# Patient Record
Sex: Male | Born: 1975 | Race: White | Hispanic: No | Marital: Married | Smoking: Never smoker
Health system: Southern US, Community
[De-identification: ages and names within clinical notes are randomized; demographics above are authoritative.]

## PROBLEM LIST (undated history)

## (undated) DIAGNOSIS — Z87898 Personal history of other specified conditions: Secondary | ICD-10-CM

## (undated) DIAGNOSIS — T7840XA Allergy, unspecified, initial encounter: Secondary | ICD-10-CM

## (undated) DIAGNOSIS — B029 Zoster without complications: Secondary | ICD-10-CM

## (undated) DIAGNOSIS — K2980 Duodenitis without bleeding: Secondary | ICD-10-CM

## (undated) DIAGNOSIS — F410 Panic disorder [episodic paroxysmal anxiety] without agoraphobia: Secondary | ICD-10-CM

## (undated) DIAGNOSIS — R61 Generalized hyperhidrosis: Secondary | ICD-10-CM

## (undated) DIAGNOSIS — K449 Diaphragmatic hernia without obstruction or gangrene: Secondary | ICD-10-CM

## (undated) DIAGNOSIS — K649 Unspecified hemorrhoids: Secondary | ICD-10-CM

## (undated) DIAGNOSIS — R011 Cardiac murmur, unspecified: Secondary | ICD-10-CM

## (undated) DIAGNOSIS — E785 Hyperlipidemia, unspecified: Secondary | ICD-10-CM

## (undated) DIAGNOSIS — F419 Anxiety disorder, unspecified: Secondary | ICD-10-CM

## (undated) DIAGNOSIS — K219 Gastro-esophageal reflux disease without esophagitis: Secondary | ICD-10-CM

## (undated) DIAGNOSIS — R9431 Abnormal electrocardiogram [ECG] [EKG]: Secondary | ICD-10-CM

## (undated) DIAGNOSIS — A048 Other specified bacterial intestinal infections: Secondary | ICD-10-CM

## (undated) HISTORY — DX: Panic disorder (episodic paroxysmal anxiety): F41.0

## (undated) HISTORY — DX: Zoster without complications: B02.9

## (undated) HISTORY — DX: Unspecified hemorrhoids: K64.9

## (undated) HISTORY — DX: Abnormal electrocardiogram (ECG) (EKG): R94.31

## (undated) HISTORY — DX: Generalized hyperhidrosis: R61

## (undated) HISTORY — DX: Personal history of other specified conditions: Z87.898

## (undated) HISTORY — PX: TONSILLECTOMY: SUR1361

## (undated) HISTORY — PX: VASECTOMY: SHX75

## (undated) HISTORY — DX: Duodenitis without bleeding: K29.80

## (undated) HISTORY — DX: Diaphragmatic hernia without obstruction or gangrene: K44.9

## (undated) HISTORY — DX: Cardiac murmur, unspecified: R01.1

## (undated) HISTORY — PX: MOLE REMOVAL: SHX2046

## (undated) HISTORY — DX: Other specified bacterial intestinal infections: A04.8

## (undated) HISTORY — PX: COLONOSCOPY: SHX174

## (undated) HISTORY — DX: Hyperlipidemia, unspecified: E78.5

## (undated) HISTORY — DX: Anxiety disorder, unspecified: F41.9

## (undated) HISTORY — DX: Allergy, unspecified, initial encounter: T78.40XA

## (undated) HISTORY — DX: Gastro-esophageal reflux disease without esophagitis: K21.9

## (undated) HISTORY — PX: POLYPECTOMY: SHX149

---

## 2005-01-31 ENCOUNTER — Ambulatory Visit: Payer: Self-pay | Admitting: Internal Medicine

## 2006-05-27 ENCOUNTER — Encounter: Payer: Self-pay | Admitting: Gastroenterology

## 2006-06-28 ENCOUNTER — Ambulatory Visit: Payer: Self-pay | Admitting: Gastroenterology

## 2006-07-19 ENCOUNTER — Ambulatory Visit: Payer: Self-pay | Admitting: Gastroenterology

## 2006-07-19 LAB — CONVERTED CEMR LAB
Fecal Occult Blood: NEGATIVE
OCCULT 4: NEGATIVE

## 2006-08-09 ENCOUNTER — Ambulatory Visit: Payer: Self-pay | Admitting: Gastroenterology

## 2006-08-09 LAB — CONVERTED CEMR LAB: Sed Rate: 7 mm/hr (ref 0–20)

## 2006-08-27 ENCOUNTER — Ambulatory Visit: Payer: Self-pay | Admitting: Gastroenterology

## 2006-08-27 ENCOUNTER — Encounter: Payer: Self-pay | Admitting: Gastroenterology

## 2006-08-27 DIAGNOSIS — K2970 Gastritis, unspecified, without bleeding: Secondary | ICD-10-CM | POA: Insufficient documentation

## 2006-08-27 DIAGNOSIS — K2990 Gastroduodenitis, unspecified, without bleeding: Secondary | ICD-10-CM

## 2006-08-27 DIAGNOSIS — K2980 Duodenitis without bleeding: Secondary | ICD-10-CM | POA: Insufficient documentation

## 2006-08-27 DIAGNOSIS — K449 Diaphragmatic hernia without obstruction or gangrene: Secondary | ICD-10-CM | POA: Insufficient documentation

## 2007-07-19 DIAGNOSIS — K625 Hemorrhage of anus and rectum: Secondary | ICD-10-CM | POA: Insufficient documentation

## 2007-07-19 DIAGNOSIS — R51 Headache: Secondary | ICD-10-CM | POA: Insufficient documentation

## 2007-07-19 DIAGNOSIS — R0789 Other chest pain: Secondary | ICD-10-CM | POA: Insufficient documentation

## 2007-07-19 DIAGNOSIS — R519 Headache, unspecified: Secondary | ICD-10-CM | POA: Insufficient documentation

## 2009-07-08 DIAGNOSIS — Z8042 Family history of malignant neoplasm of prostate: Secondary | ICD-10-CM | POA: Insufficient documentation

## 2009-07-08 DIAGNOSIS — R011 Cardiac murmur, unspecified: Secondary | ICD-10-CM | POA: Insufficient documentation

## 2009-07-08 DIAGNOSIS — E785 Hyperlipidemia, unspecified: Secondary | ICD-10-CM | POA: Insufficient documentation

## 2009-07-08 DIAGNOSIS — R7301 Impaired fasting glucose: Secondary | ICD-10-CM | POA: Insufficient documentation

## 2009-07-08 DIAGNOSIS — F419 Anxiety disorder, unspecified: Secondary | ICD-10-CM | POA: Insufficient documentation

## 2009-07-30 DIAGNOSIS — R61 Generalized hyperhidrosis: Secondary | ICD-10-CM | POA: Insufficient documentation

## 2009-08-10 ENCOUNTER — Encounter: Payer: Self-pay | Admitting: Cardiology

## 2009-09-29 ENCOUNTER — Ambulatory Visit: Payer: Self-pay | Admitting: Cardiology

## 2009-09-29 DIAGNOSIS — R9431 Abnormal electrocardiogram [ECG] [EKG]: Secondary | ICD-10-CM | POA: Insufficient documentation

## 2009-10-19 ENCOUNTER — Ambulatory Visit: Payer: Self-pay | Admitting: Internal Medicine

## 2009-10-19 ENCOUNTER — Ambulatory Visit (HOSPITAL_COMMUNITY): Admission: RE | Admit: 2009-10-19 | Discharge: 2009-10-19 | Payer: Self-pay | Admitting: Cardiology

## 2009-10-19 ENCOUNTER — Encounter: Payer: Self-pay | Admitting: Cardiology

## 2009-10-19 ENCOUNTER — Ambulatory Visit: Payer: Self-pay

## 2009-10-21 ENCOUNTER — Telehealth (INDEPENDENT_AMBULATORY_CARE_PROVIDER_SITE_OTHER): Payer: Self-pay | Admitting: *Deleted

## 2009-10-22 ENCOUNTER — Telehealth (INDEPENDENT_AMBULATORY_CARE_PROVIDER_SITE_OTHER): Payer: Self-pay | Admitting: *Deleted

## 2009-10-22 ENCOUNTER — Telehealth: Payer: Self-pay | Admitting: Cardiology

## 2010-02-24 DIAGNOSIS — R059 Cough, unspecified: Secondary | ICD-10-CM | POA: Insufficient documentation

## 2010-04-05 NOTE — Letter (Signed)
Summary: [facility]: Referral Form  [facility]: Referral Form   Imported By: Earl Many 10/01/2009 11:43:50  _____________________________________________________________________  External Attachment:    Type:   Image     Comment:   External Document

## 2010-04-05 NOTE — Assessment & Plan Note (Signed)
Summary: NP6/ CARDIAC MURMUR/CHEST PAINS-MB   Visit Type:  new pt visit Referring Provider:  Dr. Waynard Mcconnell Primary Provider:  Dr. Waynard Mcconnell  CC:  pt referred by Tyler. Waynard Mcconnell for chest discomfort pt states though that Tyler. Waynard Mcconnell thinks this may be related to when pt had shingles back in 04..pt states he has a family h/o Hypocardiomyopathy...no cardiac complaints today..Tyler. Also sees pt's father and brother.  History of Present Illness: Tyler Mcconnell it is a delightful 35 year old married white male who comes today for evaluation of chest discomfort and a family history of hypertrophic obstructive cardiomyopathy.  His brother this patient on an as very mild nonobstructive hypertrophic cardiopathy. His brother's daughter has just been diagnosed as well.  Mr. Tyler Mcconnell describes his discomfort as being across his left chest radiating into the back. He is really in the area over his breast and below. It is described as a dull ache and it comes and goes spontaneously. It is not made worse by exercise. He is an avid runner and has no limitations including shortness of breath, presyncope, palpitations or chest pain.  He has a history of shingles affecting that area of his chest in the past.  There is no family history of sudden cardiac death.  His risk factors are minimal for any coronary disease.  He is been told he has a heart murmur and an abnormal EKG. In fact, he carries a copy of his EKG and his wallet. EKG shows very narrow non-diagnostic deep Q waves inferiorly in 23 and aVF with increased R wave in V1.  Apparently he had a workup in [place] but doesn't remember being told that he had any previous injury to his heart or other problems. Details unknown.  Current Medications (verified): 1)  Lovaza 1 Gm Caps (Omega-3-Acid Ethyl Esters) .Marland Kitchen.. 1 Cap At Bedtime 2)  Nexium 40 Mg Cpdr (Esomeprazole Magnesium) .Marland Kitchen.. 1 Cap As Needed 3)  Multivitamins   Tabs (Multiple Vitamin) .... As Needed  Allergies  (verified): 1)  ! Pcn  Past History:  Past Medical History: Last updated: 07/19/2007 Current Problems:  HEADACHE, CHRONIC (ICD-784.0) CHEST PAIN, ATYPICAL (ICD-786.59) RECTAL BLEEDING (ICD-569.3) GASTRITIS (ICD-535.50) DUODENITIS, WITHOUT HEMORRHAGE (ICD-535.60) HIATAL HERNIA (ICD-553.3)  Past Surgical History: Last updated: 07/19/2007 Tonsillectomy 1997  Review of Systems       negative other than history of present illness  Vital Signs:  Patient profile:   35 year old male Height:      70 inches Weight:      176.8 pounds BMI:     25.46 Pulse rate:   61 / minute Pulse rhythm:   irregular BP sitting:   118 / 60  (left arm) Cuff size:   large  Vitals Entered By: Tyler Mcconnell, CMA (September 29, 2009 3:03 PM)  Physical Exam  General:  Well developed, well nourished, in no acute distress. Head:  normocephalic and atraumatic Eyes:  PERRLA/EOM intact; conjunctiva and lids normal. Neck:  Neck supple, no JVD. No masses, thyromegaly or abnormal cervical nodes. Chest Tyler Mcconnell:  no deformities or breast masses noted Lungs:  Clear bilaterally to auscultation and percussion. Heart:  PMI nondisplaced, regular rate and rhythm, normal S1 and S2 splits. Systolic murmur grade 2/6 at the apex and left lower sternal border. It does not radiate to the carotids. It does not intensify from lying to standing. His carotid upstrokes are not bisferien. Abdomen:  Bowel sounds positive; abdomen soft and non-tender without masses, organomegaly, or hernias noted. No hepatosplenomegaly. Msk:  Back  normal, normal gait. Muscle strength and tone normal. Pulses:  pulses normal in all 4 extremities Extremities:  No clubbing or cyanosis. Neurologic:  Alert and oriented x 3. Skin:  Intact without lesions or rashes. Psych:  Normal affect.   Problems:  Medical Problems Added: 1)  Dx of Abnormal Ekg  (ICD-794.31)  EKG  Procedure date:  09/29/2009  Findings:      normal sinus rhythm, normal intervals,  narrow Q.'s in 23 and aVF, increased R wave in V1. No significant indication of LVH  Impression & Recommendations:  Problem # 1:  CHEST PAIN, ATYPICAL (ICD-786.59) Assessment New This Is not cardiac discomfort. I agree with Tyler.Perini that this is most likely neuralgic in nature from previous shingles. This discomfort would not in the something I would associate with hypertrophic cardiomyopathy. We will obtain a 2-D echocardiogram to evaluate for asymmetric septal hypertrophy or other types of hypertrophy. His systolic murmur need to be assessed as well, specifically to differentiate a left ventricular outflow tract murmur from being benign or related to asymmetric septal hypertrophy. Orders: Echocardiogram (Echo)  Problem # 2:  ABNORMAL EKG (ICD-794.31) This is unchanged from Tyler.Perini's office. It is not typical of hypertrophic cardiomyopathy but this clearly needs to be ruled out. He is carrying a copy of this in his wallet which I reinforced is extremely important to have on hand with future medical evaluations. Echocardiogram will be obtained as mentioned above. I doubt very seriously his head any injury to his inferior Tyler Mcconnell, in fact the Q waves are not pathologic.  Patient Instructions: 1)  Your physician recommends that you schedule a follow-up appointment as needed with Tyler Mcconnell 2)  Your physician has requested that you have an echocardiogram.  Echocardiography is a painless test that uses sound waves to create images of your heart. It provides your doctor with information about the size and shape of your heart and how well your heart's chambers and valves are working.  This procedure takes approximately one hour. There are no restrictions for this procedure.

## 2010-04-05 NOTE — Progress Notes (Signed)
  Request recieved from [facility] sent to [facility]. Cala Bradford Mesiemore  October 21, 2009 10:50 AM

## 2010-04-05 NOTE — Progress Notes (Signed)
Summary: [facility]: Office Visit  [facility]: Office Visit   Imported By: Earl Many 10/01/2009 11:48:50  _____________________________________________________________________  External Attachment:    Type:   Image     Comment:   External Document

## 2010-04-05 NOTE — Progress Notes (Signed)
Summary: test results-call after 1pm  Phone Note Call from Patient Call back at Home Phone [phone] Call back at Work Phone [phone] Call back at pls call after 1 p.m.    Caller: Patient Reason for Call: Talk to Nurse, Lab or Test Results Initial call taken by: Lorne Skeens,  October 22, 2009 9:54 AM  Follow-up for Phone Call        PT AWARE OF ECHO RESULTS. Follow-up by: Scherrie Bateman, LPN,  October 22, 2009 1:46 PM    Additional Follow-up for Phone Call Additional follow up Details #2::    Per pt returning [facility] call Lorne Skeens  October 22, 2009 1:26 PM

## 2010-04-05 NOTE — Progress Notes (Signed)
  Request recieved from [facility] sent to [facility]  October 22, 2009 11:21 AM

## 2010-07-19 NOTE — Assessment & Plan Note (Signed)
[facility]                         GASTROENTEROLOGY OFFICE NOTE   NAME:Tyler Mcconnell                          MRN:          [id number]  DATE:08/09/2006                            DOB:          1975/04/17    [name] continues with episodic left precordial chest pain which he  describes as a 2 out of 10 dull aching pain which may come and last for  several days and then be gone for a few weeks.  There are no real  precipitating or alleviating elements to his pain.  He has been on  regular PPI therapy for the last 6 weeks without improvement.  He denies  true reflux symptoms, dysphagia, has no hepatobiliary complaints.  He  apparently has had cardiac evaluations in [place] including stress  testing which was normal and medical evaluation by Dr. Waynard Edwards.  He  denies any other cardiopulmonary complaints, although he does give a  history of a soft murmur.  Recent lab data on June 28, 2006 showed a  normal CBC and metabolic profile.  The patient does not smoke and uses  ethanol socially.  He specifically denies cough, worsening pain with  inspiration, sputum production, hemoptysis , palpitations, any  relationship with his pain to exertion, or any other gastrointestinal  problems.   I did initially see him for some hemorrhoidal  type rectal bleeding and  hemoccult cards  returned on Jul 19, 2006 were entirely normal.  He is  having regular bowel movements at this time.  The only medication he is  on at this time is Nexium 40 mg a day.   EXAMINATION:  Shows him to weigh 174 pounds and blood pressure of  130/62.  Pulse was 72 and regular.  I could not appreciate stigmata of chronic liver disease.  His chest was entirely clear to percussion and auscultation without  rales , rhonchi, or rubs.  He appeared to be in a regular rhythm without  significant murmurs, gallops or rubs that I could appreciate.  The abdominal exam was unremarkable.  There was no  CVA tenderness.   ASSESSMENT:  [name] has a very atypical chest pain and I doubt this is  from gastroesophageal reflux disease since he has had no response  whatsoever to PPI therapy.  He gives no associated gastrointestinal  complaints otherwise to suggest esophageal spasm.  However, I do think  we need to complete his gastrointestinal work up since he has had no  response to empiric therapy.   RECOMMENDATIONS:  1. Upper abdominal ultrasound exam.  2. Upper gastrointestinal endoscopy.  3. We will discontinue Nexium at this time and proceed accordingly.  4. Check a sed rate.     Vania Rea. Jarold Motto, MD, Caleen Essex, FAGA  Electronically Signed    DRP/MedQ  DD: 08/09/2006  DT: 08/09/2006  Job #: [id number]   cc:   Loraine Leriche A. Perini, M.D.

## 2010-07-22 NOTE — Assessment & Plan Note (Signed)
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  He is referred today  because of intermittent asymptomatic rectal bleeding and atypical chest  pain.   Tyler Mcconnell has been in good health all of his life, without serious medical  or surgical problems.  He has since 2002 he has had intermittent  episodes of dull achy left precordial chest pain, which will last  several days in duration.  This is not really associated with any food  reflux symptoms, such as regurgitation or burning pain, or radiation to  his back.  It is not exercise related.  He has had a negative cardiac  workup.  He denies cough, sputum production, hoarseness, or other  problems such as asthmatic bronchitis.  He is followed by Dr. Waynard Edwards,  and has a scheduled complete physical examination this week.   He denies any hepatobiliary complaints; such as clay-colored stools,  dark urine, icterus, fever, chills or history of hepatitis or  pancreatitis.  He has no cough or hemoptysis.   His bowels move regularly, but he had an episode 2-3 weeks ago of rather  significant bright red blood per rectum.  There was no associated rectal  or abdominal pain.  His bowels have been regular since that time.  He  was traveling at the time.  He has never had barium studies or  endoscopic examinations.  He follows a regular diet and denies any  specific food intolerances.  He denies any systemic complaints.   PAST MEDICAL HISTORY:  Entirely noncontributory.  He did have a  tonsillectomy in 1997.   FAMILY HISTORY:  Unremarkable for known gastrointestinal problems.  His  father did have prostate cancer.   SOCIAL HISTORY:  He is married and lives with his wife.  He has a  bachelor's degree from [employer].   He does not smoke and uses ethanol  socially.  He works in the Engineer, site.   REVIEW OF SYSTEMS:  Noncontributory.   MEDICATIONS:  He denies the use of NSAIDs, but does take salicylates  several times a week.   PHYSICAL EXAMINATION:  VITAL SIGNS:  He is 5 feet 10 inches tall, weight  175 pounds.  Blood pressure 136/86, pulse 76 and regular.  GENERAL:  Shows him to be a healthy-appearing white male in no distress.  His stated age.  I cannot appreciate stigmata of chronic liver disease,  or thyromegaly.  CHEST:  Entirely clear.  CARDIAC:  Exam showed no murmurs, gallops or rubs.  He was in a regular  rhythm.  ABDOMEN:  I could not appreciate hepatosplenomegaly, abdominal masses or  tenderness.  EXTREMITIES:  Peripheral extremities are unremarkable.  NEUROLOGIC:  Mental status was clear.  RECTAL:  Inspection of the rectum showed no effusions or fistulae, and  rectal examination showed no masses or tenderness; with a guaiac-  negative stool.  ASSESSMENT:  1. Atypical chest pain, which is possibly secondary to acid reflux.  2. Asymptomatic rectal bleeding, probably from internal hemorrhoidal      bleeding.   RECOMMENDATIONS:  1. Trial of anti-reflux regime, along with Nexium 40 mg 30 minutes      before breakfast on a regular basis; with GI follow-up in one      month's time.  2. High fiber diet as tolerated.  3. Outpatient Hemocult cards.  4. Consider endoscopic examination of his intestinal tract, depending      on his workup and clinical course.  5. Will ask Dr. Waynard Edwards to send me a copy of his labs, which are to be      drawn today.     Vania Rea. Jarold Motto, MD, Caleen Essex, FAGA  Electronically Signed    DRP/MedQ  DD: 06/28/2006  DT: 06/28/2006  Job #: [id number]   cc:   Loraine Leriche A. Perini, M.D.

## 2011-12-01 ENCOUNTER — Encounter: Payer: Self-pay | Admitting: Cardiology

## 2015-02-05 DIAGNOSIS — M546 Pain in thoracic spine: Secondary | ICD-10-CM | POA: Insufficient documentation

## 2015-04-09 DIAGNOSIS — Z0000 Encounter for general adult medical examination without abnormal findings: Secondary | ICD-10-CM | POA: Insufficient documentation

## 2016-04-27 DIAGNOSIS — E784 Other hyperlipidemia: Secondary | ICD-10-CM | POA: Diagnosis not present

## 2016-06-21 ENCOUNTER — Encounter: Payer: Self-pay | Admitting: Internal Medicine

## 2016-07-11 DIAGNOSIS — R7301 Impaired fasting glucose: Secondary | ICD-10-CM | POA: Diagnosis not present

## 2016-07-11 DIAGNOSIS — Z125 Encounter for screening for malignant neoplasm of prostate: Secondary | ICD-10-CM | POA: Diagnosis not present

## 2016-07-11 DIAGNOSIS — Z0000 Encounter for general adult medical examination without abnormal findings: Secondary | ICD-10-CM | POA: Diagnosis not present

## 2016-07-11 DIAGNOSIS — E785 Hyperlipidemia, unspecified: Secondary | ICD-10-CM | POA: Diagnosis not present

## 2016-07-18 DIAGNOSIS — Z23 Encounter for immunization: Secondary | ICD-10-CM | POA: Diagnosis not present

## 2016-07-18 DIAGNOSIS — Z0000 Encounter for general adult medical examination without abnormal findings: Secondary | ICD-10-CM | POA: Diagnosis not present

## 2016-07-18 DIAGNOSIS — M546 Pain in thoracic spine: Secondary | ICD-10-CM | POA: Diagnosis not present

## 2016-07-18 DIAGNOSIS — J45998 Other asthma: Secondary | ICD-10-CM | POA: Diagnosis not present

## 2016-07-18 DIAGNOSIS — R61 Generalized hyperhidrosis: Secondary | ICD-10-CM | POA: Diagnosis not present

## 2016-07-18 DIAGNOSIS — Z1389 Encounter for screening for other disorder: Secondary | ICD-10-CM | POA: Diagnosis not present

## 2016-07-21 ENCOUNTER — Encounter: Payer: Self-pay | Admitting: *Deleted

## 2016-08-02 ENCOUNTER — Encounter: Payer: Self-pay | Admitting: *Deleted

## 2016-08-07 ENCOUNTER — Ambulatory Visit (INDEPENDENT_AMBULATORY_CARE_PROVIDER_SITE_OTHER): Payer: 59 | Admitting: Internal Medicine

## 2016-08-07 ENCOUNTER — Encounter: Payer: Self-pay | Admitting: Internal Medicine

## 2016-08-07 ENCOUNTER — Encounter (INDEPENDENT_AMBULATORY_CARE_PROVIDER_SITE_OTHER): Payer: Self-pay

## 2016-08-07 VITALS — BP 124/74 | HR 68 | Ht 70.0 in | Wt 175.0 lb

## 2016-08-07 DIAGNOSIS — Z8371 Family history of colonic polyps: Secondary | ICD-10-CM

## 2016-08-07 DIAGNOSIS — K219 Gastro-esophageal reflux disease without esophagitis: Secondary | ICD-10-CM

## 2016-08-07 DIAGNOSIS — K625 Hemorrhage of anus and rectum: Secondary | ICD-10-CM

## 2016-08-07 DIAGNOSIS — Z83719 Family history of colon polyps, unspecified: Secondary | ICD-10-CM

## 2016-08-07 DIAGNOSIS — Z8619 Personal history of other infectious and parasitic diseases: Secondary | ICD-10-CM | POA: Diagnosis not present

## 2016-08-07 DIAGNOSIS — K449 Diaphragmatic hernia without obstruction or gangrene: Secondary | ICD-10-CM

## 2016-08-07 MED ORDER — NA SULFATE-K SULFATE-MG SULF 17.5-3.13-1.6 GM/177ML PO SOLN: ORAL | 0 refills | Status: DC

## 2016-08-07 MED ORDER — RANITIDINE HCL 150 MG PO TABS: 150.0000 mg | ORAL_TABLET | Freq: Two times a day (BID) | ORAL | 2 refills | Status: DC | PRN

## 2016-08-07 NOTE — Patient Instructions (Signed)
You have been scheduled for an endoscopy and colonoscopy. Please follow the written instructions given to you at your visit today. Please pick up your prep supplies at the pharmacy within the next 1-3 days. If you use inhalers (even only as needed), please bring them with you on the day of your procedure. Your physician has requested that you go to www.startemmi.com and enter the access code given to you at your visit today. This web site gives a general overview about your procedure. However, you should still follow specific instructions given to you by our office regarding your preparation for the procedure.  We have sent the following medications to your pharmacy for you to pick up at your convenience: Zantac 150 mg twice daily as needed  If you are age 17 or older, your body mass index should be between 23-30. Your Body mass index is 25.11 kg/m. If this is out of the aforementioned range listed, please consider follow up with your Primary Care Provider.  If you are age 17 or younger, your body mass index should be between 19-25. Your Body mass index is 25.11 kg/m. If this is out of the aformentioned range listed, please consider follow up with your Primary Care Provider.

## 2016-08-07 NOTE — Progress Notes (Signed)
Patient ID: Tyler Mcconnell, male   DOB: 1976-02-21, 41 y.o.   MRN: [id number] HPI: Tyler Mcconnell is a 41 year old male with a past medical history of GERD, hiatal hernia, H. pylori diagnosed and treated by Dr. Sharlett Iles in 2008 who is seen to discuss reflux and family history of colon polyps. He is here alone today.  Records reviewed indicating upper endoscopy performed on 08/27/2006. This revealed an irregular Z line which was biopsied. There was a 4 cm hiatal hernia. There was also nodularity and erythema in the gastric mucosa and duodenal bulb. Biopsies were performed from the stomach as well. Stomach biopsy showed chronic gastritis with H. pylori. No metaplasia or dysplasia. GE junction biopsies showed mild inflammation consistent with reflux without intestinal metaplasia.  He reports that he does have issues with generalized reflux both mild heartburn as well as burping and belching. This occurs fairly infrequently but consistently after large meals or if he eats late at night. He has used Zantac with complete resolution of symptoms at 150 mg dose. The 75 mg dose is less effective. He denies dysphagia, odynophagia. He denies nausea, vomiting, early satiety or weight loss.  Bowel habits are regular he has 1-2 bowel movements daily. Stools are soft but formed. Denies constipation diarrhea. He does report if he strains he will see red blood with wiping. This occurs 6-8 times per year. He denies blood in the stool and melena.  His sister, also a patient in my practice, Tyler Mcconnell, has had 2 colonoscopies beginning at age 19. She had multiple adenomatous colon polyps one with high-grade dysplasia at initial colonoscopy for screening at age 69 She was sent for genetics evaluation which was negative for colon polyps syndrome. Of note this is his half sister, they share a father.  Past Medical History:  Diagnosis Date  . Abnormal EKG   . Anxiety   . Duodenitis   . GERD (gastroesophageal reflux disease)   .  H. pylori infection   . H/O chest pain   . Heart murmur   . Hemorrhoids   . Hiatal hernia   . Hyperhidrosis   . Hyperlipidemia   . Panic attack   . Shingles     Past Surgical History:  Procedure Laterality Date  . MOLE REMOVAL    . TONSILLECTOMY    . VASECTOMY      Outpatient Medications Prior to Visit  Medication Sig Dispense Refill  . esomeprazole (NEXIUM) 40 MG capsule Take 40 mg by mouth daily before breakfast.    . Multiple Vitamin (MULTIVITAMIN) capsule Take 1 capsule by mouth daily.    Marland Kitchen omega-3 acid ethyl esters (LOVAZA) 1 G capsule Take 2 g by mouth 2 (two) times daily.     No facility-administered medications prior to visit.     Allergies  Allergen Reactions  . Penicillins     REACTION: rash    Family History  Problem Relation Age of Onset  . Heart disease Unknown   . Prostate cancer Father   . Colon polyps Sister   . Prostate cancer Paternal Grandfather     Social History  Substance Use Topics  . Smoking status: Never Smoker  . Smokeless tobacco: Never Used  . Alcohol use Yes    ROS: As per history of present illness, otherwise negative  BP 124/74   Pulse 68   Ht 5\' 10"  (1.778 m)   Wt 175 lb (79.4 kg)   SpO2 98%   BMI 25.11 kg/m  Constitutional: Well-developed and  well-nourished. No distress. HEENT: Normocephalic and atraumatic. Oropharynx is clear and moist. Conjunctivae are normal.  No scleral icterus. Neck: Neck supple. Trachea midline. Cardiovascular: Normal rate, regular rhythm and intact distal pulses. No M/R/G Pulmonary/chest: Effort normal and breath sounds normal. No wheezing, rales or rhonchi. Abdominal: Soft, nontender, nondistended. Bowel sounds active throughout.   Extremities: no clubbing, cyanosis, or edema Neurological: Alert and oriented to person place and time. Skin: Skin is warm and dry.  Psychiatric: Normal mood and affect. Behavior is normal.  ASSESSMENT/PLAN: 41 year old male with a past medical history of GERD,  hiatal hernia, H. pylori diagnosed and treated by Dr. Sharlett Iles in 2008 who is seen to discuss reflux and family history of colon polyps.  1. GERD/hiatal hernia -- he does have symptoms consistent with acid reflux disease. In general symptoms are mild. There are no alarm symptoms. He did have a 4 cm hiatal hernia and irregular Z line but no evidence of Barrett's by biopsy. Due to his chronic GERD, I recommended we repeat upper endoscopy to exclude Barrett's esophagus. We discussed the risks, benefits and alternatives and he is agreeable and wishes to proceed. In the interim we reviewed GERD dietary modifications and he can use Zantac 150 mg twice a day when necessary. At present I do not see the need for daily PPI.  2. History of H. Pylori -- treated in 2008. We'll plan gastric biopsies to confirm eradication at upper endoscopy as described in #1  3. Family history of advanced adenoma in sister age 59 -- I recommended that we perform screening colonoscopy at this time given his family history of advanced adenoma in his sister. This is 10 years before her initial colonoscopy. We discussed the risks, benefits and alternatives and he is agreeable and wishes to proceed  4. Intermittent rectal bleeding -- very likely due to internal hemorrhoids given the painless nature and occurring with any straining. Colonoscopy is discussed in #3 will rule out other pathology which is felt less likely.

## 2016-08-08 NOTE — Addendum Note (Signed)
Addended by: Larina Bras on: 08/08/2016 11:32 AM   Modules accepted: Orders

## 2016-08-22 DIAGNOSIS — Z8249 Family history of ischemic heart disease and other diseases of the circulatory system: Secondary | ICD-10-CM | POA: Insufficient documentation

## 2016-10-04 ENCOUNTER — Other Ambulatory Visit: Payer: 59

## 2016-10-04 ENCOUNTER — Encounter: Payer: Self-pay | Admitting: Internal Medicine

## 2016-10-04 ENCOUNTER — Telehealth: Payer: Self-pay | Admitting: *Deleted

## 2016-10-04 DIAGNOSIS — R197 Diarrhea, unspecified: Secondary | ICD-10-CM

## 2016-10-04 NOTE — Telephone Encounter (Signed)
-----   Message ----- From: Jerene Bears, MD Sent: 10/03/2016  11:19 PM To: Larina Bras, CMA Subject: GI panel                                       Hulen Shouts needs GI pathogen panel.send stat.  JMP

## 2016-10-04 NOTE — Telephone Encounter (Signed)
I have left voicemail for patient to come for labwork as soon as possible. Orders entered in EPIC.

## 2016-10-05 LAB — GASTROINTESTINAL PATHOGEN PANEL PCR
C. DIFFICILE TOX A/B, PCR: NOT DETECTED
CAMPYLOBACTER, PCR: NOT DETECTED
CRYPTOSPORIDIUM, PCR: NOT DETECTED
E coli (ETEC) LT/ST PCR: NOT DETECTED
E coli (STEC) stx1/stx2, PCR: NOT DETECTED
E coli 0157, PCR: NOT DETECTED
GIARDIA LAMBLIA, PCR: NOT DETECTED
Norovirus, PCR: NOT DETECTED
ROTAVIRUS, PCR: NOT DETECTED
Salmonella, PCR: NOT DETECTED
Shigella, PCR: NOT DETECTED

## 2016-10-17 ENCOUNTER — Ambulatory Visit (AMBULATORY_SURGERY_CENTER): Payer: 59 | Admitting: Internal Medicine

## 2016-10-17 ENCOUNTER — Encounter: Payer: Self-pay | Admitting: Internal Medicine

## 2016-10-17 VITALS — BP 124/88 | HR 52 | Temp 98.2°F | Resp 16 | Ht 70.0 in | Wt 175.0 lb

## 2016-10-17 DIAGNOSIS — Z8619 Personal history of other infectious and parasitic diseases: Secondary | ICD-10-CM

## 2016-10-17 DIAGNOSIS — K219 Gastro-esophageal reflux disease without esophagitis: Secondary | ICD-10-CM

## 2016-10-17 DIAGNOSIS — D122 Benign neoplasm of ascending colon: Secondary | ICD-10-CM

## 2016-10-17 DIAGNOSIS — Z1212 Encounter for screening for malignant neoplasm of rectum: Secondary | ICD-10-CM | POA: Diagnosis not present

## 2016-10-17 DIAGNOSIS — K2950 Unspecified chronic gastritis without bleeding: Secondary | ICD-10-CM | POA: Diagnosis not present

## 2016-10-17 DIAGNOSIS — Z1211 Encounter for screening for malignant neoplasm of colon: Secondary | ICD-10-CM | POA: Diagnosis not present

## 2016-10-17 DIAGNOSIS — Z8371 Family history of colonic polyps: Secondary | ICD-10-CM

## 2016-10-17 MED ORDER — SODIUM CHLORIDE 0.9 % IV SOLN: 500.0000 mL | INTRAVENOUS | Status: DC

## 2016-10-17 NOTE — Op Note (Signed)
[place] Patient Name: Tyler Mcconnell Procedure Date: 10/17/2016 8:31 AM MRN: [id number] Endoscopist: Jerene Bears , MD Age: 41 Referring MD:  Date of Birth: Nov 13, 1975 Gender: Male Account #: [id number] Procedure:                Colonoscopy Indications:              Colon cancer screening in patient at increased                            risk: Family history of 1st-degree relative                            (sister) with multiple colon polyps before age 78                            years, This is the patient's first colonoscopy Medicines:                Monitored Anesthesia Care Procedure:                Pre-Anesthesia Assessment:                           - Prior to the procedure, a History and Physical                            was performed, and patient medications and                            allergies were reviewed. The patient's tolerance of                            previous anesthesia was also reviewed. The risks                            and benefits of the procedure and the sedation                            options and risks were discussed with the patient.                            All questions were answered, and informed consent                            was obtained. Prior Anticoagulants: The patient has                            taken no previous anticoagulant or antiplatelet                            agents. ASA Grade Assessment: II - A patient with                            mild systemic disease. After reviewing the risks  and benefits, the patient was deemed in                            satisfactory condition to undergo the procedure.                           After obtaining informed consent, the colonoscope                            was passed under direct vision. Throughout the                            procedure, the patient's blood pressure, pulse, and                            oxygen saturations were  monitored continuously. The                            Colonoscope was introduced through the anus and                            advanced to the the terminal ileum. The colonoscopy                            was performed without difficulty. The patient                            tolerated the procedure well. The quality of the                            bowel preparation was excellent. The terminal                            ileum, ileocecal valve, appendiceal orifice, and                            rectum were photographed. Scope In: 8:46:21 AM Scope Out: 9:02:50 AM Scope Withdrawal Time: 0 hours 13 minutes 48 seconds  Total Procedure Duration: 0 hours 16 minutes 29 seconds  Findings:                 The digital rectal exam was normal.                           The terminal ileum appeared normal.                           A 6 mm polyp was found in the ascending colon. The                            polyp was sessile. The polyp was removed with a                            cold snare. Resection and retrieval were complete.  Internal hemorrhoids were found during                            retroflexion. The hemorrhoids were small.                           The exam was otherwise without abnormality. Complications:            No immediate complications. Estimated Blood Loss:     Estimated blood loss: none. Impression:               - The examined portion of the ileum was normal.                           - One 6 mm polyp in the ascending colon, removed                            with a cold snare. Resected and retrieved.                           - Internal hemorrhoids.                           - The examination was otherwise normal. Recommendation:           - Patient has a contact number available for                            emergencies. The signs and symptoms of potential                            delayed complications were discussed with the                             patient. Return to normal activities tomorrow.                            Written discharge instructions were provided to the                            patient.                           - Resume previous diet.                           - Continue present medications.                           - Await pathology results.                           - Repeat colonoscopy is recommended. The                            colonoscopy date will be determined after pathology  results from today's exam become available for                            review. Jerene Bears, MD 10/17/2016 9:12:57 AM This report has been signed electronically.

## 2016-10-17 NOTE — Progress Notes (Signed)
Report to PACU, RN, vss, BBS= Clear.  

## 2016-10-17 NOTE — Op Note (Signed)
[place] Patient Name: Tyler Mcconnell Procedure Date: 10/17/2016 8:31 AM MRN: [id number] Endoscopist: Jerene Bears , MD Age: 41 Referring MD:  Date of Birth: 1975-11-01 Gender: Male Account #: [id number] Procedure:                Upper GI endoscopy Indications:              Gastro-esophageal reflux disease, remote history of                            Helicobacter pylori Medicines:                Monitored Anesthesia Care Procedure:                Pre-Anesthesia Assessment:                           - Prior to the procedure, a History and Physical                            was performed, and patient medications and                            allergies were reviewed. The patient's tolerance of                            previous anesthesia was also reviewed. The risks                            and benefits of the procedure and the sedation                            options and risks were discussed with the patient.                            All questions were answered, and informed consent                            was obtained. Prior Anticoagulants: The patient has                            taken no previous anticoagulant or antiplatelet                            agents. ASA Grade Assessment: II - A patient with                            mild systemic disease. After reviewing the risks                            and benefits, the patient was deemed in                            satisfactory condition to undergo the procedure.  After obtaining informed consent, the endoscope was                            passed under direct vision. Throughout the                            procedure, the patient's blood pressure, pulse, and                            oxygen saturations were monitored continuously. The                            Endoscope was introduced through the mouth, and                            advanced to the second part of duodenum.  The upper                            GI endoscopy was accomplished without difficulty.                            The patient tolerated the procedure well. Scope In: Scope Out: Findings:                 LA Grade A (one or more mucosal breaks less than 5                            mm, not extending between tops of 2 mucosal folds)                            esophagitis was found at the gastroesophageal                            junction. There is no evidence of Barrett's                            esophagus.                           The exam of the esophagus was otherwise normal.                           The entire examined stomach was normal. Biopsies                            were taken with a cold forceps for histology and                            Helicobacter pylori testing (gastric body, antrum,                            incisura).                           The examined duodenum  was normal. Complications:            No immediate complications. Estimated Blood Loss:     Estimated blood loss was minimal. Impression:               - Very mild reflux esophagitis. No evidence for                            Barrett's esophagus.                           - Normal stomach. Biopsied.                           - Normal examined duodenum. Recommendation:           - Patient has a contact number available for                            emergencies. The signs and symptoms of potential                            delayed complications were discussed with the                            patient. Return to normal activities tomorrow.                            Written discharge instructions were provided to the                            patient.                           - Resume previous diet.                           - Continue present medications.                           - Await pathology results. Jerene Bears, MD 10/17/2016 9:09:40 AM This report has been signed electronically.

## 2016-10-17 NOTE — Patient Instructions (Signed)
Handouts given: Polyps, Hemorrhoids and Esophagitis.  YOU HAD AN ENDOSCOPIC PROCEDURE TODAY AT [facility]:   Refer to the procedure report that was given to you for any specific questions about what was found during the examination.  If the procedure report does not answer your questions, please call your gastroenterologist to clarify.  If you requested that your care partner not be given the details of your procedure findings, then the procedure report has been included in a sealed envelope for you to review at your convenience later.  YOU SHOULD EXPECT: Some feelings of bloating in the abdomen. Passage of more gas than usual.  Walking can help get rid of the air that was put into your GI tract during the procedure and reduce the bloating. If you had a lower endoscopy (such as a colonoscopy or flexible sigmoidoscopy) you may notice spotting of blood in your stool or on the toilet paper. If you underwent a bowel prep for your procedure, you may not have a normal bowel movement for a few days.  Please Note:  You might notice some irritation and congestion in your nose or some drainage.  This is from the oxygen used during your procedure.  There is no need for concern and it should clear up in a day or so.  SYMPTOMS TO REPORT IMMEDIATELY:   Following lower endoscopy (colonoscopy or flexible sigmoidoscopy):  Excessive amounts of blood in the stool  Significant tenderness or worsening of abdominal pains  Swelling of the abdomen that is new, acute  Fever of 100F or higher   Following upper endoscopy (EGD)  Vomiting of blood or coffee ground material  New chest pain or pain under the shoulder blades  Painful or persistently difficult swallowing  New shortness of breath  Fever of 100F or higher  Black, tarry-looking stools  For urgent or emergent issues, a gastroenterologist can be reached at any hour by calling [phone].   DIET:  We do recommend a small meal at first,  but then you may proceed to your regular diet.  Drink plenty of fluids but you should avoid alcoholic beverages for 24 hours.  ACTIVITY:  You should plan to take it easy for the rest of today and you should NOT DRIVE or use heavy machinery until tomorrow (because of the sedation medicines used during the test).    FOLLOW UP: Our staff will call the number listed on your records the next business day following your procedure to check on you and address any questions or concerns that you may have regarding the information given to you following your procedure. If we do not reach you, we will leave a message.  However, if you are feeling well and you are not experiencing any problems, there is no need to return our call.  We will assume that you have returned to your regular daily activities without incident.  If any biopsies were taken you will be contacted by phone or by letter within the next 1-3 weeks.  Please call us at [phone] if you have not heard about the biopsies in 3 weeks.    SIGNATURES/CONFIDENTIALITY: You and/or your care partner have signed paperwork which will be entered into your electronic medical record.  These signatures attest to the fact that that the information above on your After Visit Summary has been reviewed and is understood.  Full responsibility of the confidentiality of this discharge information lies with you and/or your care-partner.

## 2016-10-17 NOTE — Progress Notes (Signed)
Called to room to assist during endoscopic procedure.  Patient ID and intended procedure confirmed with present staff. Received instructions for my participation in the procedure from the performing physician.  

## 2016-10-18 ENCOUNTER — Telehealth: Payer: Self-pay

## 2016-10-18 NOTE — Telephone Encounter (Signed)
Name identifier, 2nd follow up phone call. Left a message.

## 2016-10-18 NOTE — Telephone Encounter (Signed)
Left message on answering machine. 

## 2016-10-20 ENCOUNTER — Other Ambulatory Visit: Payer: Self-pay | Admitting: *Deleted

## 2016-10-20 MED ORDER — RANITIDINE HCL 150 MG PO TABS: 150.0000 mg | ORAL_TABLET | Freq: Two times a day (BID) | ORAL | 1 refills | Status: DC | PRN

## 2016-12-15 DIAGNOSIS — Z23 Encounter for immunization: Secondary | ICD-10-CM | POA: Diagnosis not present

## 2017-04-09 DIAGNOSIS — D224 Melanocytic nevi of scalp and neck: Secondary | ICD-10-CM | POA: Diagnosis not present

## 2017-04-09 DIAGNOSIS — D225 Melanocytic nevi of trunk: Secondary | ICD-10-CM | POA: Diagnosis not present

## 2017-04-09 DIAGNOSIS — D2261 Melanocytic nevi of right upper limb, including shoulder: Secondary | ICD-10-CM | POA: Diagnosis not present

## 2017-04-09 DIAGNOSIS — D485 Neoplasm of uncertain behavior of skin: Secondary | ICD-10-CM | POA: Diagnosis not present

## 2017-04-11 DIAGNOSIS — Z8249 Family history of ischemic heart disease and other diseases of the circulatory system: Secondary | ICD-10-CM | POA: Diagnosis not present

## 2017-04-20 DIAGNOSIS — Z8249 Family history of ischemic heart disease and other diseases of the circulatory system: Secondary | ICD-10-CM | POA: Diagnosis not present

## 2017-09-18 ENCOUNTER — Encounter: Payer: Self-pay | Admitting: Cardiology

## 2017-09-18 DIAGNOSIS — Z0000 Encounter for general adult medical examination without abnormal findings: Secondary | ICD-10-CM | POA: Diagnosis not present

## 2017-09-18 DIAGNOSIS — R82998 Other abnormal findings in urine: Secondary | ICD-10-CM | POA: Diagnosis not present

## 2017-09-18 DIAGNOSIS — E785 Hyperlipidemia, unspecified: Secondary | ICD-10-CM | POA: Diagnosis not present

## 2017-09-26 DIAGNOSIS — R9431 Abnormal electrocardiogram [ECG] [EKG]: Secondary | ICD-10-CM | POA: Diagnosis not present

## 2017-09-26 DIAGNOSIS — Z0000 Encounter for general adult medical examination without abnormal findings: Secondary | ICD-10-CM | POA: Diagnosis not present

## 2017-09-26 DIAGNOSIS — Z1389 Encounter for screening for other disorder: Secondary | ICD-10-CM | POA: Diagnosis not present

## 2017-09-26 DIAGNOSIS — Z8249 Family history of ischemic heart disease and other diseases of the circulatory system: Secondary | ICD-10-CM | POA: Diagnosis not present

## 2017-09-26 DIAGNOSIS — Z8371 Family history of colonic polyps: Secondary | ICD-10-CM | POA: Diagnosis not present

## 2017-09-26 DIAGNOSIS — D126 Benign neoplasm of colon, unspecified: Secondary | ICD-10-CM | POA: Insufficient documentation

## 2017-12-06 DIAGNOSIS — Z23 Encounter for immunization: Secondary | ICD-10-CM | POA: Diagnosis not present

## 2018-01-01 ENCOUNTER — Other Ambulatory Visit (HOSPITAL_COMMUNITY): Payer: 59

## 2018-01-07 ENCOUNTER — Other Ambulatory Visit: Payer: Self-pay

## 2018-01-07 ENCOUNTER — Other Ambulatory Visit (HOSPITAL_COMMUNITY): Payer: Self-pay | Admitting: Internal Medicine

## 2018-01-07 ENCOUNTER — Ambulatory Visit (HOSPITAL_COMMUNITY): Payer: 59 | Attending: Cardiovascular Disease

## 2018-01-07 ENCOUNTER — Encounter (INDEPENDENT_AMBULATORY_CARE_PROVIDER_SITE_OTHER): Payer: Self-pay

## 2018-01-07 DIAGNOSIS — Z8249 Family history of ischemic heart disease and other diseases of the circulatory system: Secondary | ICD-10-CM | POA: Diagnosis not present

## 2018-01-22 DIAGNOSIS — E7849 Other hyperlipidemia: Secondary | ICD-10-CM | POA: Diagnosis not present

## 2018-01-22 DIAGNOSIS — R7301 Impaired fasting glucose: Secondary | ICD-10-CM | POA: Diagnosis not present

## 2018-02-13 ENCOUNTER — Encounter: Payer: Self-pay | Admitting: Cardiology

## 2018-03-08 NOTE — Progress Notes (Signed)
Cardiology Office Note   Date:  03/11/2018   ID:  Ulyess Blossom, DOB 11/01/1975, MRN [id number]  PCP:  Crist Infante, MD  Cardiologist:   Nalany Steedley Martinique, MD   Chief Complaint  Patient presents with  . Abnormal ECG      History of Present Illness: TATUM MASSMAN is a 43 y.o. male who is seen today for evaluation of family history of hypertrophic cardiomyopathy. His brother and niece and nephew have been diagnosed with HCM and have genetic variant MYBPC3 p.Arg495Gln; Viola; 5885. His father and paternal grandmother had an abnormal Ecg and some cardiac irregularity but have not been formally tested for HCM.  The patient did undergo recent genetic testing at [facility] and also has the gene. No history of any cardiac complaints. Nuclear stress test in 2008 was normal. Echo done in the remote past. Denies any chest pain, dyspnea, dizziness, palpitations. He is active and runs and works out regularly. He has 3 children ages 69, 8, and 4. He does have hypercholesterolemia.     Past Medical History:  Diagnosis Date  . Abnormal EKG   . Anxiety   . Duodenitis   . GERD (gastroesophageal reflux disease)   . H. pylori infection   . H/O chest pain   . Heart murmur   . Hemorrhoids   . Hiatal hernia   . Hyperhidrosis   . Hyperlipidemia   . Panic attack   . Shingles     Past Surgical History:  Procedure Laterality Date  . MOLE REMOVAL    . TONSILLECTOMY    . VASECTOMY       Current Outpatient Medications  Medication Sig Dispense Refill  . ranitidine (ZANTAC) 150 MG tablet Take 1 tablet (150 mg total) by mouth 2 (two) times daily as needed for heartburn. As needed 180 tablet 1   Current Facility-Administered Medications  Medication Dose Route Frequency Provider Last Rate Last Dose  . 0.9 %  sodium chloride infusion  500 mL Intravenous Continuous Pyrtle, Lajuan Lines, MD        Allergies:   Penicillins    Social History:  The patient  reports that he has never smoked. He has never used  smokeless tobacco. He reports current alcohol use. He reports that he does not use drugs.   Family History:  The patient's family history includes Colon polyps in his sister; Heart disease in an other family member; Hyperlipidemia in his father; Irregular heart beat in his father and paternal grandmother; Prostate cancer in his paternal grandfather; Prostate cancer (age of onset: 7) in his father; Thyroid nodules (age of onset: 47) in his mother.    ROS:  Please see the history of present illness.   Otherwise, review of systems are positive for none.   All other systems are reviewed and negative.    PHYSICAL EXAM: VS:  BP 126/88 (BP Location: Left Arm, Patient Position: Sitting, Cuff Size: Normal)   Pulse (!) 58   Ht 5' 10.5" (1.791 m)   Wt 181 lb 9.6 oz (82.4 kg)   BMI 25.69 kg/m  , BMI Body mass index is 25.69 kg/m. GEN: Well nourished, thin WM in no acute distress  HEENT: normal  Neck: no JVD, carotid bruits, or masses Cardiac: RRR; no murmurs, rubs, or gallops,no edema  Respiratory:  clear to auscultation bilaterally, normal work of breathing GI: soft, nontender, nondistended, + BS MS: no deformity or atrophy  Skin: warm and dry, no rash Neuro:  Strength and  sensation are intact Psych: euthymic mood, full affect   EKG:  EKG is ordered today. The ekg ordered today demonstrates sinus brady rate 58. Normal Ecg. I have personally reviewed and interpreted this study.    Recent Labs: No results found for requested labs within last 8760 hours.    Lipid Panel No results found for: CHOL, TRIG, HDL, CHOLHDL, VLDL, LDLCALC, LDLDIRECT    Wt Readings from Last 3 Encounters:  03/11/18 181 lb 9.6 oz (82.4 kg)  10/17/16 175 lb (79.4 kg)  08/07/16 175 lb (79.4 kg)      Other studies Reviewed: Additional studies/ records that were reviewed today include: Labs dated 09/18/17: CBC, CMET, TSH normal.  Dated 01/22/18: cholesterol 246, triglycerides 114, HDL 50, LDL 173.    Echo  01/07/18: Study Conclusions  - Left ventricle: The cavity size was normal. Systolic function was   normal. The estimated ejection fraction was in the range of 55%   to 60%. Wall motion was normal; there were no regional wall   motion abnormalities. Left ventricular diastolic function   parameters were normal.   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 He has a brother and nephew with some morphologic changes and a niece who apparently has more severe changes and is s/p ICD implant. Likely inherited from his father. At this point no further work up or treatment for him is needed. I would recommend an Echo about every 5 years. We will refer his children for genetic testing.  2. Hypercholesterolemia. We discussed options for statin treatment, no treatment, or treatment based on further risk assessment with coronary calcium scoring. He would like to pursue calcium scoring. If he does have coronary calcification I would recommend statin therapy.   Current medicines are reviewed at length with the patient today.  The patient does not have concerns regarding medicines.  The following changes have been made:  no change  Labs/ tests ordered today include:   Orders Placed This Encounter  Procedures  . CT CARDIAC SCORING  . EKG 12-Lead     Disposition:   TBD  Signed, Latonja Bobeck Martinique, MD  03/11/2018 12:13 PM    [facility] [address] [place], [place], [address] Phone [phone], Fax [phone]

## 2018-03-11 ENCOUNTER — Encounter: Payer: Self-pay | Admitting: Cardiology

## 2018-03-11 ENCOUNTER — Ambulatory Visit: Payer: 59 | Admitting: Cardiology

## 2018-03-11 VITALS — BP 126/88 | HR 58 | Ht 70.5 in | Wt 181.6 lb

## 2018-03-11 DIAGNOSIS — R0789 Other chest pain: Secondary | ICD-10-CM

## 2018-03-11 DIAGNOSIS — I422 Other hypertrophic cardiomyopathy: Secondary | ICD-10-CM | POA: Diagnosis not present

## 2018-03-11 DIAGNOSIS — E7800 Pure hypercholesterolemia, unspecified: Secondary | ICD-10-CM | POA: Diagnosis not present

## 2018-03-11 NOTE — Patient Instructions (Signed)
Medication Instructions:  No changes   Lab work: None ordered   Testing/Procedures: Cardiac Calcium Score  Follow-Up: At [facility], you and your health needs are our priority.  As part of our continuing mission to provide you with exceptional heart care, we have created designated Provider Care Teams.  These Care Teams include your primary Cardiologist (physician) and Advanced Practice Providers (APPs -  Physician Assistants and Nurse Practitioners) who all work together to provide you with the care you need, when you need it. . Follow Up depends on results  Children need to be referred to Geneticist  for HOCM

## 2018-03-21 ENCOUNTER — Ambulatory Visit (INDEPENDENT_AMBULATORY_CARE_PROVIDER_SITE_OTHER)
Admission: RE | Admit: 2018-03-21 | Discharge: 2018-03-21 | Disposition: A | Discharge status: Home / Self Care | Payer: Self-pay | Source: Ambulatory Visit | Attending: Cardiology | Admitting: Cardiology

## 2018-03-21 DIAGNOSIS — R0789 Other chest pain: Secondary | ICD-10-CM

## 2018-03-22 ENCOUNTER — Other Ambulatory Visit: Payer: Self-pay

## 2018-03-22 DIAGNOSIS — R9431 Abnormal electrocardiogram [ECG] [EKG]: Secondary | ICD-10-CM

## 2018-03-22 DIAGNOSIS — R0789 Other chest pain: Secondary | ICD-10-CM

## 2018-03-22 MED ORDER — ROSUVASTATIN CALCIUM 20 MG PO TABS: 20.0000 mg | ORAL_TABLET | Freq: Every day | ORAL | 6 refills | Status: DC

## 2018-05-09 ENCOUNTER — Other Ambulatory Visit: Payer: Self-pay | Admitting: Cardiology

## 2018-06-03 DIAGNOSIS — M79672 Pain in left foot: Secondary | ICD-10-CM | POA: Diagnosis not present

## 2018-06-03 DIAGNOSIS — M25572 Pain in left ankle and joints of left foot: Secondary | ICD-10-CM | POA: Diagnosis not present

## 2018-06-20 DIAGNOSIS — M25572 Pain in left ankle and joints of left foot: Secondary | ICD-10-CM | POA: Diagnosis not present

## 2018-07-11 DIAGNOSIS — R9431 Abnormal electrocardiogram [ECG] [EKG]: Secondary | ICD-10-CM | POA: Diagnosis not present

## 2018-07-11 DIAGNOSIS — R0789 Other chest pain: Secondary | ICD-10-CM | POA: Diagnosis not present

## 2018-07-11 LAB — LIPID PANEL
CHOL/HDL RATIO: 3 ratio (ref 0.0–5.0)
Cholesterol, Total: 137 mg/dL (ref 100–199)
HDL: 46 mg/dL (ref >39)
LDL Calculated: 73 mg/dL (ref 0–99)
Triglycerides: 92 mg/dL (ref 0–149)
VLDL Cholesterol Cal: 18 mg/dL (ref 5–40)

## 2018-07-11 LAB — HEPATIC FUNCTION PANEL
ALBUMIN: 5.4 g/dL — AB (ref 4.0–5.0)
ALT: 48 IU/L — ABNORMAL HIGH (ref 0–44)
AST: 27 IU/L (ref 0–40)
Alkaline Phosphatase: 80 IU/L (ref 39–117)
Bilirubin Total: 0.6 mg/dL (ref 0.0–1.2)
Bilirubin, Direct: 0.19 mg/dL (ref 0.00–0.40)
Total Protein: 7.3 g/dL (ref 6.0–8.5)

## 2018-07-15 ENCOUNTER — Telehealth: Payer: Self-pay | Admitting: Cardiology

## 2018-07-15 NOTE — Telephone Encounter (Signed)
Return call to pt to provide lab results. Left results on voicemail per message.

## 2018-07-15 NOTE — Telephone Encounter (Signed)
Patient was returning Cheryl's call to discuss results of recent lab work.  Patient says you can leave a detailed message on his machine if you are unable to reach him

## 2019-02-07 ENCOUNTER — Other Ambulatory Visit: Payer: Self-pay

## 2019-02-07 DIAGNOSIS — Z20822 Contact with and (suspected) exposure to covid-19: Secondary | ICD-10-CM

## 2019-02-12 LAB — NOVEL CORONAVIRUS, NAA: SARS-CoV-2, NAA: NOT DETECTED

## 2019-03-28 ENCOUNTER — Other Ambulatory Visit: Payer: 59

## 2019-05-09 ENCOUNTER — Other Ambulatory Visit: Payer: Self-pay | Admitting: Cardiology

## 2019-05-13 ENCOUNTER — Other Ambulatory Visit: Payer: Self-pay

## 2019-05-19 ENCOUNTER — Telehealth: Payer: Self-pay | Admitting: Cardiology

## 2019-05-19 MED ORDER — ROSUVASTATIN CALCIUM 20 MG PO TABS: 20.0000 mg | ORAL_TABLET | Freq: Every day | ORAL | 11 refills | Status: DC

## 2019-05-19 NOTE — Telephone Encounter (Signed)
The refill has been sent in.

## 2019-05-19 NOTE — Telephone Encounter (Signed)
New message   Patient needs a new prescription for rosuvastatin (CRESTOR) 20 MG tablet sent to [id number] - [place], [place] - [address]

## 2019-09-29 IMAGING — CT CT HEART SCORING
2 series · 16 of 20 positions shown, 18 images · non-contrast
Comparison: None.

Addendum:
EXAM:
OVER-READ INTERPRETATION  CT CHEST

The following report is an over-read performed by radiologist Dr.
Csetneki Chukeir [REDACTED] on 03/21/2018. This over-read
does not include interpretation of cardiac or coronary anatomy or
pathology. The coronary calcium score interpretation by the
cardiologist is attached.
CLINICAL DATA: Risk stratification
Coronary Calcium Score
TECHNIQUE: The patient was scanned on a Siemens Force scanner. Axial
non-contrast 3 mm slices were carried out through the heart. The
data set was analyzed on a dedicated work station and scored using
the Agatson method.

[Series 3: casc 3.0 i36f 2 bestdiast 68 % · axial · 0.35mm/px · z∈[-230,-134]mm · 8 of 42 slices shown, 10 images]
[im 5/42  vessel]
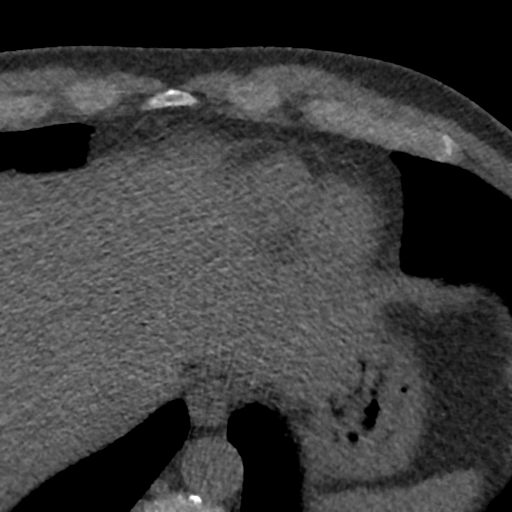
[im 5/42  lung]
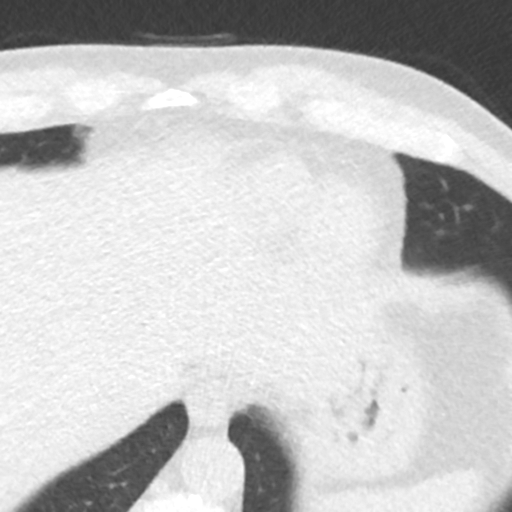
[im 10/42  vessel]
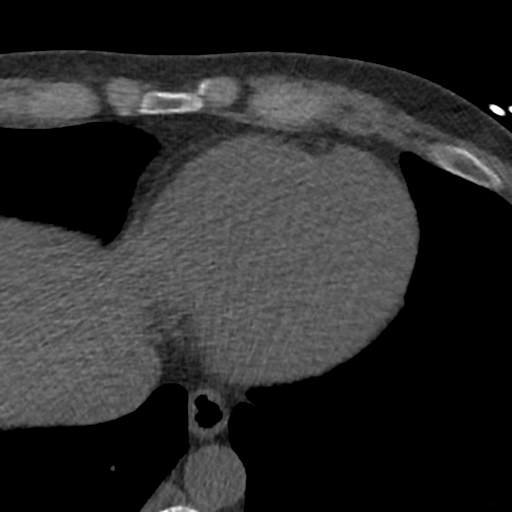
[im 14/42  vessel]
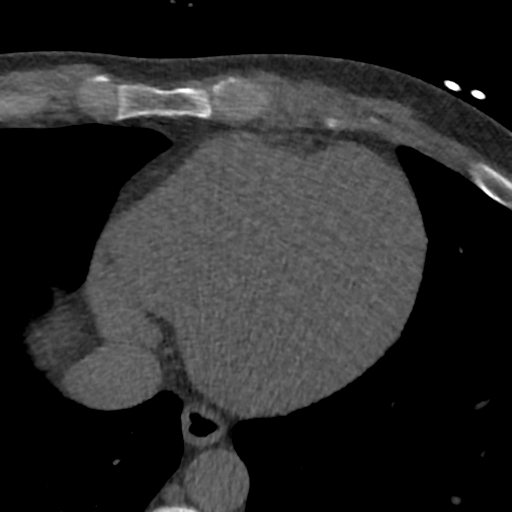
[im 19/42  vessel]
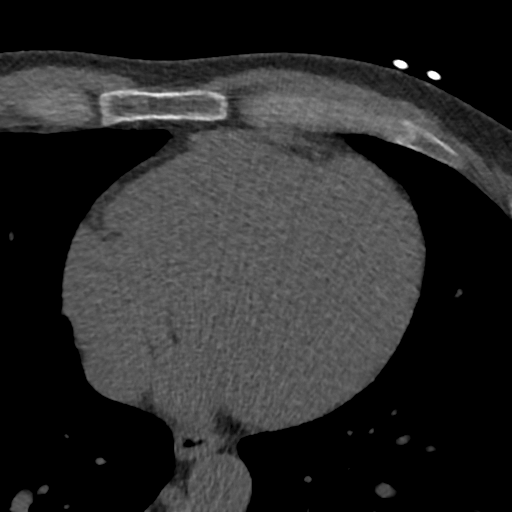
[im 23/42  vessel]
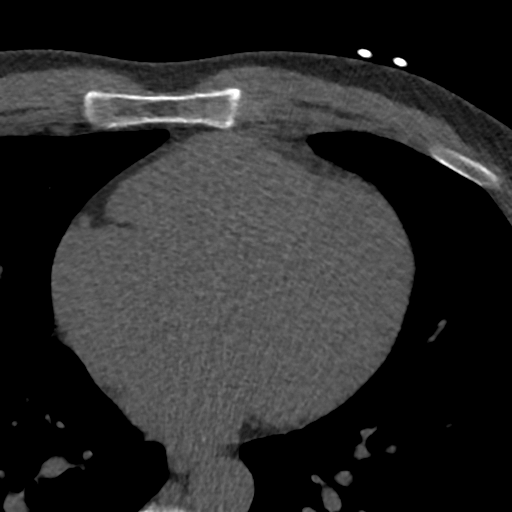
[im 23/42  lung]
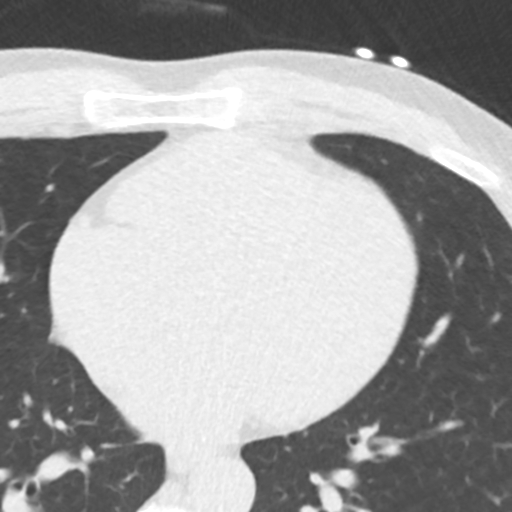
[im 28/42  vessel]
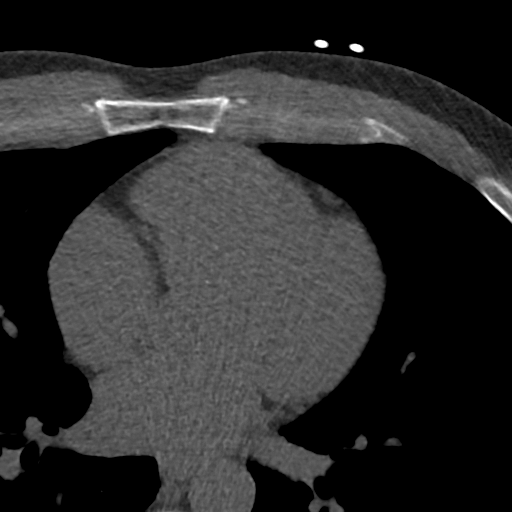
[im 32/42  vessel]
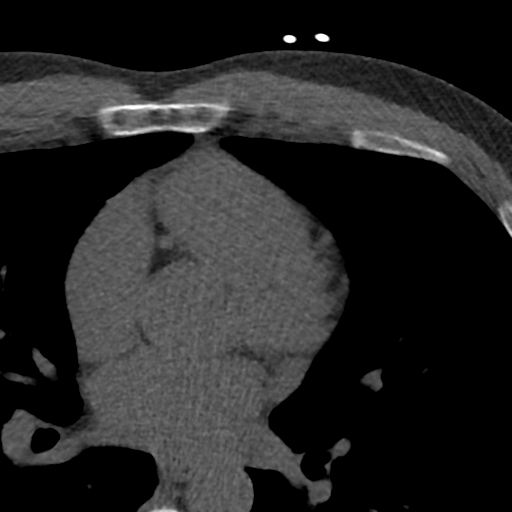
[im 37/42  vessel]
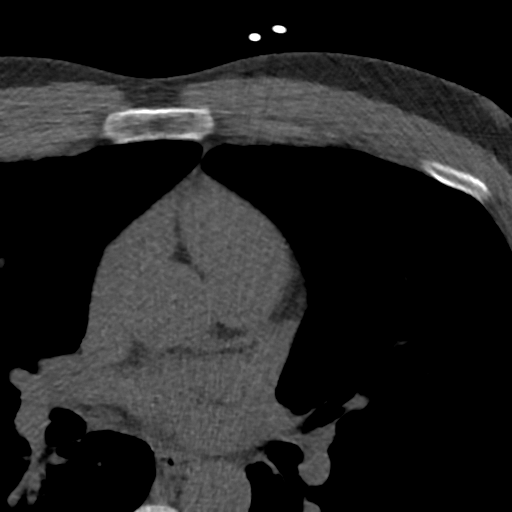

[Series 5: lung st 68 % · axial · 0.68mm/px · z∈[-230,-134]mm · 8 of 42 slices shown]
[im 5/42  lung]
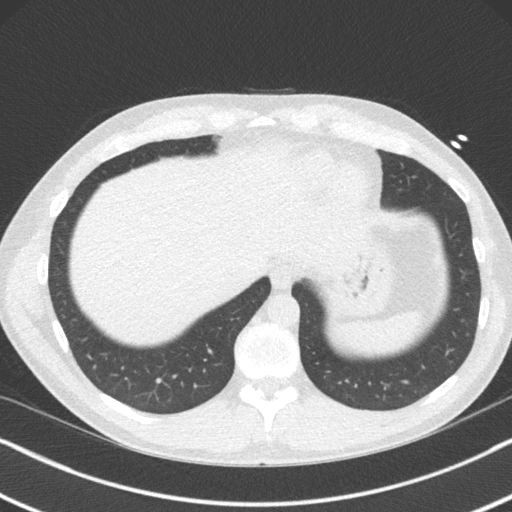
[im 10/42  lung]
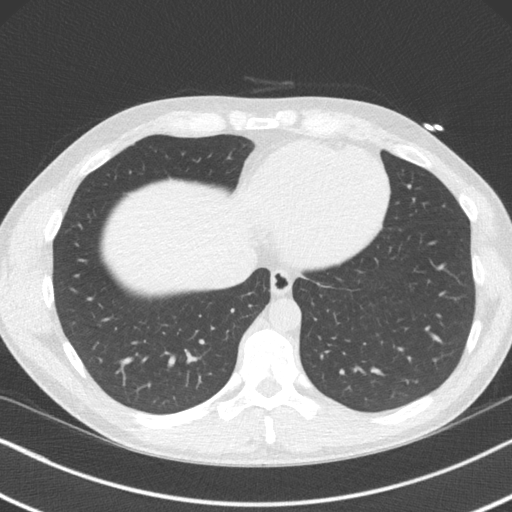
[im 14/42  lung]
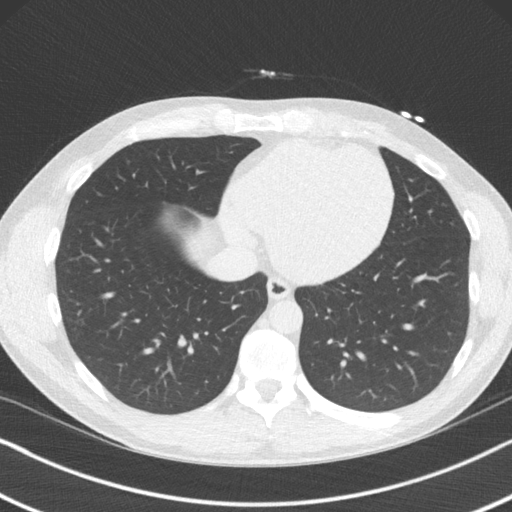
[im 19/42  lung]
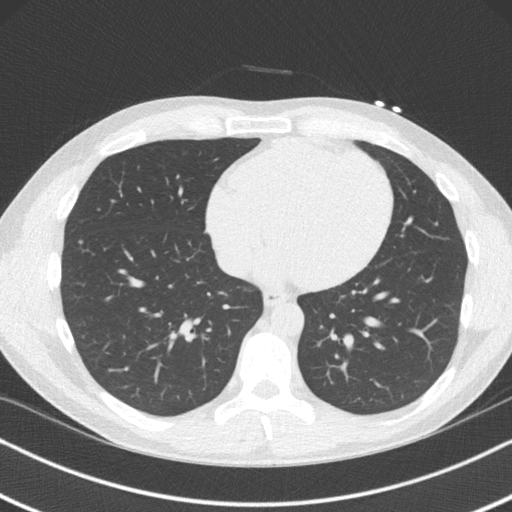
[im 23/42  lung]
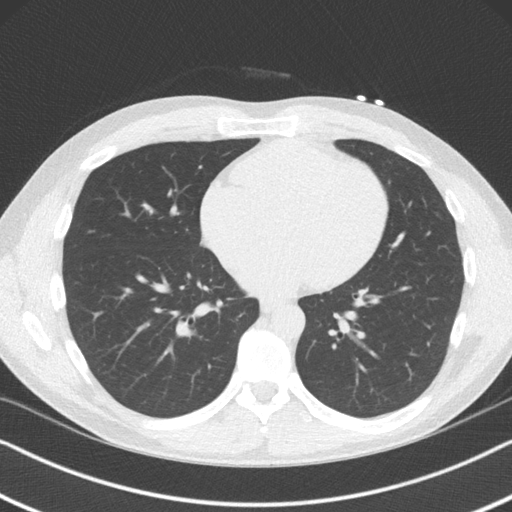
[im 28/42  lung]
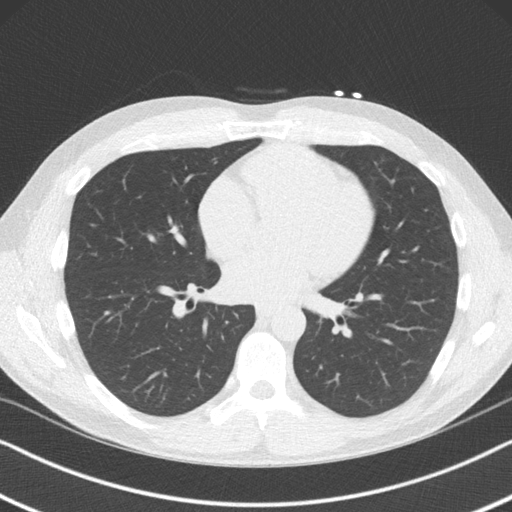
[im 32/42  lung]
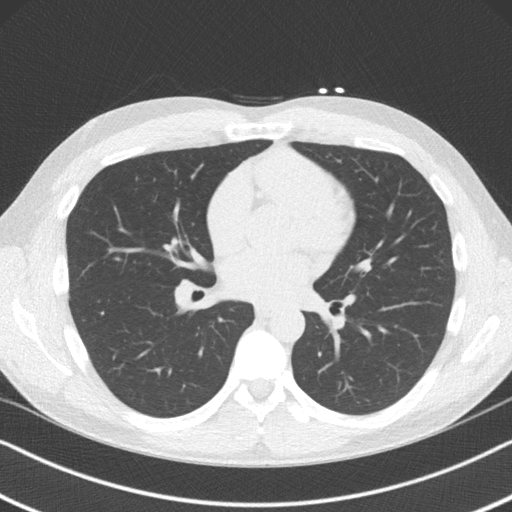
[im 37/42  lung]
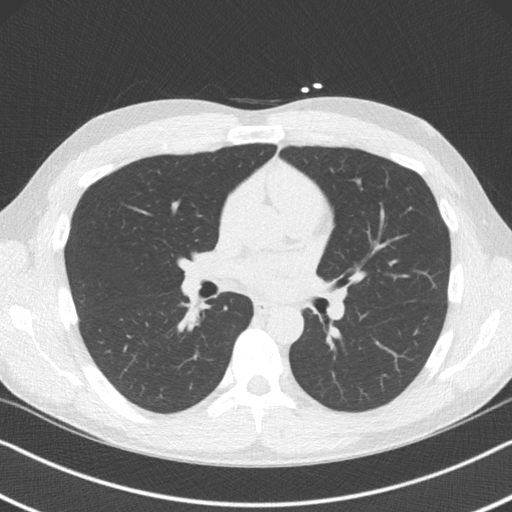

[16 of 20 positions shown; findings below may reference images not displayed]

FINDINGS: Vascular: Heart is normal size.  Visualized aorta is normal caliber.

Mediastinum/Nodes: No adenopathy in the lower mediastinum or hila.

Lungs/Pleura: Visualized lungs clear.  No effusions.

Upper Abdomen: Imaging into the upper abdomen shows no acute
findings.

Musculoskeletal: Chest wall soft tissues are unremarkable. No acute
bony abnormality.
IMPRESSION: No acute or significant extracardiac abnormality.
FINDINGS: Non-cardiac: See separate report from [REDACTED].

Ascending Aorta: Normal size, no calcifications.

Pericardium: Normal.

Coronary arteries: Normal origin.
IMPRESSION: Coronary calcium score of 4.4. This was 79 percentile for age and
sex matched control.

*** End of Addendum ***

## 2019-10-03 DIAGNOSIS — S0033XA Contusion of nose, initial encounter: Secondary | ICD-10-CM | POA: Insufficient documentation

## 2020-01-16 ENCOUNTER — Encounter: Payer: Self-pay | Admitting: Psychiatry

## 2020-01-16 ENCOUNTER — Other Ambulatory Visit: Payer: Self-pay

## 2020-01-16 ENCOUNTER — Ambulatory Visit (INDEPENDENT_AMBULATORY_CARE_PROVIDER_SITE_OTHER): Payer: 59 | Admitting: Psychiatry

## 2020-01-16 DIAGNOSIS — F4323 Adjustment disorder with mixed anxiety and depressed mood: Secondary | ICD-10-CM | POA: Diagnosis not present

## 2020-01-16 NOTE — Progress Notes (Signed)
[facility] Counselor Initial Adult Exam  Name: Tyler Mcconnell Date: 01/16/2020 MRN: [id number] DOB: 1997-03-02 PCP: Crist Infante, MD  Time spent: 60 minutes   Guardian/Payee:  none    Paperwork requested:  No   Reason for Visit /Presenting Problem: The client states today, "I am eternally restless.  I have an infatuation with time.  I have a very hard time with stillness.  Why?"  The client states that at times his thoughts can race especially if he is trying to go to sleep.  Once or twice a week he will only get 3 hours or so of sleep.  His thoughts can go very negative especially around work issues.  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  He has a very intense job which requires quite a bit of him.  He describes himself as always busy and that rest and stillness are uncomfortable for him he was diagnosed in second grade with attention deficit hyperactivity disorder and started on Ritalin which he took from third grade to 12th grade.  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  When he went to the [employer] at [employer] he went off his Ritalin and worked with the student disability office to develop other skills to manage his ADHD.  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   He states it is sad to him that his mood bleeds off onto his wife and children.  He describes himself being quick to irritation with a maniacal push to go, go, go.  He describes that he has muscle tension and feels wound up.  "It Shemeca Lukasik be anxiety."  He grinds his teeth at night.  He finds himself being too intense and ultimately ends up being sharp with people.  "Then I feel guilt and shame.  I can feel flat at different times during the week and I do not like who I am."  He states deep conversations with his wife drains him.  He states  that being introspective makes him want to pull away.  He will typically sleep 7 hours but at some point in the week he will have difficulty with sleep and be up a lot.  He takes no psychiatric medication but will take melatonin to help with his sleep.  He exercises 2x3 a week.  His main activity is running. He has been married for 15 years.  He has 3 children.  He describes a good relationship with his wife and children.  His own childhood was active.  He had a good relationship with both of his parents.  They were present and engaged.  "We always had dinner together.  He is the youngest of 42 children.  He has a 42 year old sister, a 49 year old brother and then himself.  There is approximately 7 years between each of them.  "Since my siblings are so spread out we are almost like only children."  He states that his parents had a low tolerance for hard things so they were never discussed.  If hard things came up he states his parents would change the subject.  The client is very involved in his local church but does not think that a relationship with God is important to his parents.  He describes no traumas in his life. The client  identified the following goals for treatment; Goals 1) increase my awareness and understanding of what is going on with me. 2) unpack what are the lies behind my inability to rest or be still. 3) develop better tools to cope. 4) develop mindfulness so I can stay in the present tense. 5) restructure negative cognitions to more appropriate rational ones. The client identified a number of negative cognitions.  "I do not like myself (the negative side of me).  I am disappointed in myself.  I have failed as a father and husband.  I could do more for the [place].  I am not measuring up (be more impactful).  There is something else I should be doing."  I discussed to supplements with the client, 5 HTP and l-theanine.  I suggested he look into those to help with his sleep.  He agreed. I  discussed the use of EMDR with the client as a way to restructure his negative cognitions to more appropriate rational ones.  It would also decrease his negative emotional content and hopefully increase his overall margin in life.  We discussed skills training and problem solving strategies.  The client agreed to treatment.  Mental Status Exam:   Appearance:   Well Groomed     Behavior:  Appropriate  Motor:  Normal  Speech/Language:   Clear and Coherent  Affect:  Appropriate  Mood:  anxious and irritable  Thought process:  normal  Thought content:    Rumination  Sensory/Perceptual disturbances:    WNL  Orientation:  oriented to person, place, time/date and situation  Attention:  Good  Concentration:  Good  Memory:  WNL  Fund of knowledge:   Good  Insight:    Good  Judgment:   Good  Impulse Control:  Good   Reported Symptoms:  Periodic racing thoughts, sleep disruption, irritable, anxious, muscle tension, sense of restlessness  Risk Assessment: Danger to Self:  No Self-injurious Behavior: No Danger to Others: No Duty to Warn:no Physical Aggression / Violence:No  Access to Firearms a concern: No  Gang Involvement:No  Patient / guardian was educated about steps to take if suicide or homicide risk level increases between visits: yes While future psychiatric events cannot be accurately predicted, the patient does not currently require acute inpatient psychiatric care and does not currently meet [facility] involuntary commitment criteria.  Substance Abuse History: Current substance abuse: No     Past Psychiatric History:   No previous psychological problems have been observed Outpatient Providers:None History of Psych Hospitalization: No  Psychological Testing: None   Abuse History: Victim of No., NA   Report needed: No. Victim of Neglect:No. Perpetrator of NA  Witness / Exposure to Domestic Violence: No   Protective Services Involvement: No  Witness to [employer]  Violence:  No   Family History:  Family History  Problem Relation Age of Onset  . Heart disease Other   . Prostate cancer Father 71  . Hyperlipidemia Father   . Irregular heart beat Father   . Colon polyps Sister   . Prostate cancer Paternal Grandfather   . Thyroid nodules Mother 49  . Irregular heart beat Paternal Grandmother   . Colon cancer Neg Hx     Living situation: the patient lives with their family  Sexual Orientation:  Straight  Relationship Status: married  Name of spouse / other:Glory Buff             If a parent, number of children-3 / ages:10, 70, 6  Support  Systems; spouse  Financial Stress:  Yes   Income/Employment/Disability: Employment  Armed forces logistics/support/administrative officer: No   Educational History: Education: Scientist, product/process development:   [employer]  Any cultural differences that Jakai Onofre affect / interfere with treatment:  not applicable   Recreation/Hobbies: exercise, running  Stressors:Other: relationship issues related to clients irritability  Strengths:  Supportive Relationships, Family, Friends, Church, Spirituality and Able to Communicate Effectively  Barriers:  None   Legal History: Pending legal issue / charges: The patient has no significant history of legal issues. History of legal issue / charges: NA  Medical History/Surgical History:not reviewed Past Medical History:  Diagnosis Date  . Abnormal EKG   . Anxiety   . Duodenitis   . GERD (gastroesophageal reflux disease)   . H. pylori infection   . H/O chest pain   . Heart murmur   . Hemorrhoids   . Hiatal hernia   . Hyperhidrosis   . Hyperlipidemia   . Panic attack   . Shingles     Past Surgical History:  Procedure Laterality Date  . MOLE REMOVAL    . TONSILLECTOMY    . VASECTOMY      Medications: Current Outpatient Medications  Medication Sig Dispense Refill  . ranitidine (ZANTAC) 150 MG tablet Take 1 tablet (150 mg total) by mouth 2 (two) times daily as  needed for heartburn. As needed 180 tablet 1  . rosuvastatin (CRESTOR) 20 MG tablet Take 1 tablet (20 mg total) by mouth daily. 30 tablet 11   Current Facility-Administered Medications  Medication Dose Route Frequency Provider Last Rate Last Admin  . 0.9 %  sodium chloride infusion  500 mL Intravenous Continuous Pyrtle, Lajuan Lines, MD        Allergies  Allergen Reactions  . Penicillins     REACTION: rash    Diagnoses:    ICD-10-CM   1. Adjustment disorder with mixed anxiety and depressed mood  F43.23     Plan of Care: I will use a combination of talk therapy and EMDR with the client to help restructure his negative cognitions and reduce the negative emotional content.  I will then teach the client appropriate skills to augment his behaviors.  I will also teach the client problem-solving strategies.  Estimated length of treatment 8-15 sessions.   Felicidad Sugarman, [facility]

## 2020-02-09 ENCOUNTER — Other Ambulatory Visit: Payer: Self-pay | Admitting: Cardiology

## 2020-04-01 ENCOUNTER — Encounter: Payer: Self-pay | Admitting: Psychiatry

## 2020-04-01 ENCOUNTER — Other Ambulatory Visit: Payer: Self-pay

## 2020-04-01 ENCOUNTER — Ambulatory Visit (INDEPENDENT_AMBULATORY_CARE_PROVIDER_SITE_OTHER): Payer: 59 | Admitting: Psychiatry

## 2020-04-01 DIAGNOSIS — F4323 Adjustment disorder with mixed anxiety and depressed mood: Secondary | ICD-10-CM | POA: Diagnosis not present

## 2020-04-01 NOTE — Progress Notes (Signed)
[facility] Counselor/Therapist Progress Note  Patient ID: Tyler Mcconnell, MRN: [id number],    Date: 04/01/2020  Time Spent: 50 minutes   Treatment Type: Individual Therapy  Reported Symptoms: anxious, irritable, sad  Mental Status Exam:  Appearance:   Well Groomed     Behavior:  Appropriate  Motor:  Normal  Speech/Language:   Clear and Coherent  Affect:  Appropriate  Mood:  anxious, irritable and sad  Thought process:  normal  Thought content:    WNL  Sensory/Perceptual disturbances:    WNL  Orientation:  oriented to person, place, time/date and situation  Attention:  Good  Concentration:  Good  Memory:  WNL  Fund of knowledge:   Good  Insight:    Good  Judgment:   Good  Impulse Control:  Good   Risk Assessment: Danger to Self:  No Self-injurious Behavior: No Danger to Others: No Duty to Warn:no Physical Aggression / Violence:No  Access to Firearms a concern: No  Gang Involvement:No   Subjective: We reviewed the client's initial goals to which he agreed.  He stated to that he would like to add that he wants to work on his irritability with his children.  "I grind on them and cannot seem to let go."  We again discussed the 5 HTP in the l-theanine which the client will look into. Today we started with eye-movement around the client's issue with time.  Mainly he does not want to be late or be there before he needs to be there.  His negative cognition is, "I desire to control.  He feels anxiety which translates to a tension in his whole body.  His subjective units of distress is a 2+.  As the client processed he went back to a time when he was in his first internship in college he did not want to be late and always wanted to be prepared.  "That was a very strong desire for me."  As he continued to process he realized that it went back further in his school work into high school.  In graduate school he found out that he was a "J"on the [employer] type indicator.  "I am in  off the chart J".  He states it gives him an insatiable drive.  His Marcella Dubs type indicator is an ENTJ.  He states he gets that DNA from his dad.  I asked the client as he discussed this "insatiable drive" what he thought drove that?  Is it a fear of failure?  Or is it a need for affirmation?  The client believes it is both failure and affirmation.  He sees his desire for organization as a "strength overdone."  "I can really get stuck in it."  He sees that this has been driven by his relationship with his dad.  His dad was not warm or given to accolades.  "He does not show love well."  As the client continued to discuss this he realized that he probably needs to show his dad more love and not worry about if his dad reciprocates.  I asked the client what his love language was?  He identified it as acts of service.  I asked him how good he was at giving love to others by displaying physical touch such as hugs, intentional touches to the back and such.  He feels he is not good at that.  "I can give it to my kids but not my wife."  As the client  made this realization he became very tearful.  I encouraged the client to find ways to connect with his wife even if he has to put it on his personal schedule.  He agreed.  As he continued to process I asked the client how well does he love himself?  The client came back very tearfully stating "I am loved."  He felt the [facility] revealed that to him during the last eye-movement set.  "I do not need to do, to be."  This is a profound shift from transactional value versus intrinsic value.  The client sees that he is clearly been in the transactional mode.  As he continued to process the client asked himself the question "had I been at [facility] to fulfill my dad's dream?"  He felt like God told him "I had you where I wanted you.  When it is time to go I will let you know."  The client got a lot of things from this. I attempted to integrate the positive cognition, "I am loved."   He had a difficult time integrating that.  I suggested that he find scripture verses to support that.  His fear is in the will go in a narcissistic direction.  I gave the client a handout on "the father's voice."  He will review that.  I also gave him a handout on, "your wife feels loved when." He will use this as a primer to generate connection with his wife.  His subjective units of distress at the end of the session was 0.  Interventions: Assertiveness/Communication, Motivational Interviewing, Solution-Oriented/Positive Psychology, Psycho-education/Bibliotherapy, Eye Movement Desensitization and Reprocessing (EMDR) and Insight-Oriented  Diagnosis:   ICD-10-CM   1. Adjustment disorder with mixed anxiety and depressed mood  F43.23     Plan: Purchase 5 HTP and l-theanine, review handouts the father's voice and your wife feels loved when, assertiveness, boundaries, self-care, positive self talk, journaling, exercise.  Idelia Caudell, [facility]

## 2020-04-15 ENCOUNTER — Ambulatory Visit: Payer: 59 | Admitting: Psychiatry

## 2020-05-06 ENCOUNTER — Ambulatory Visit (INDEPENDENT_AMBULATORY_CARE_PROVIDER_SITE_OTHER): Payer: 59 | Admitting: Psychiatry

## 2020-05-06 ENCOUNTER — Encounter: Payer: Self-pay | Admitting: Psychiatry

## 2020-05-06 ENCOUNTER — Other Ambulatory Visit: Payer: Self-pay

## 2020-05-06 DIAGNOSIS — F4323 Adjustment disorder with mixed anxiety and depressed mood: Secondary | ICD-10-CM

## 2020-05-06 NOTE — Progress Notes (Signed)
      [facility] Counselor/Therapist Progress Note  Patient ID: Tyler Mcconnell, MRN: [id number],    Date: 05/06/2020  Time Spent: 50 minutes   Treatment Type: Individual Therapy  Reported Symptoms: anxiety, irritability, sad  Mental Status Exam:  Appearance:   Casual and Well Groomed     Behavior:  Appropriate  Motor:  Normal  Speech/Language:   Clear and Coherent  Affect:  Appropriate  Mood:  anxious, irritable and sad  Thought process:  normal  Thought content:    WNL  Sensory/Perceptual disturbances:    WNL  Orientation:  oriented to person, place, time/date and situation  Attention:  Good  Concentration:  Good  Memory:  WNL  Fund of knowledge:   Good  Insight:    Good  Judgment:   Good  Impulse Control:  Good   Risk Assessment: Danger to Self:  No Self-injurious Behavior: No Danger to Others: No Duty to Warn:no Physical Aggression / Violence:No  Access to Firearms a concern: No  Gang Involvement:No   Subjective: The client feels like last session was a breakthrough especially around issues with his dad.  He realizes that he needs to be more intentional with his wife.  They have been talking about what she needs but nothing has been set in place yet.  The client tries to think "the Reita Cliche loves me."  Currently his relationship with his father is strained but his father has also just retired. The client's expectations of himself disappoint him.  He finds that when he is with his children he is snapping at them going to school.  He describes his response as almost primal.  Today we used eye-movement focusing on this event where he took his children to school.  His daughter's yogurt pack exploded on her arm.  He lost his temper.  He saw her walking into school crying.  His negative cognition is, "I modeled bad behavior."  He feels shame and regret in his head neck and chest.  His subjective units of distress is a 5+.  As the client processed he realized how much his daughter  craves attention from him.  He needs to be much more intentional about pursuing her.  I encouraged the client to also apply this intentionality to his wife. He agrees. He realizes that he gets upset about things that really do not matter.  We discussed being more consistent in taking the 5 HTP daily at bedtime at least 200 mg of the l-theanine.  I also suggested that he preloaded in the morning with 600 mg of l-theanine.  This will hopefully over time give him more emotional margin.  I also suggested that the client use reflective listening with his children to make sure that they understand what he is saying.  His subjective units of distress is at a 0.  His positive cognition is, "these little things do not really matter."  Interventions: Assertiveness/Communication, Motivational Interviewing, Solution-Oriented/Positive Psychology, [facility] Desensitization and Reprocessing (EMDR) and Insight-Oriented  Diagnosis:   ICD-10-CM   1. Adjustment disorder with mixed anxiety and depressed mood  F43.23     Plan: Reflective listening, mood independent behavior, consistency with supplements, positive self talk, intentionality, assertiveness, boundaries.  Lashell Moffitt, [facility]

## 2020-05-18 DIAGNOSIS — M25551 Pain in right hip: Secondary | ICD-10-CM | POA: Insufficient documentation

## 2020-05-20 ENCOUNTER — Ambulatory Visit (INDEPENDENT_AMBULATORY_CARE_PROVIDER_SITE_OTHER): Payer: 59 | Admitting: Psychiatry

## 2020-05-20 ENCOUNTER — Other Ambulatory Visit: Payer: Self-pay

## 2020-05-20 ENCOUNTER — Encounter: Payer: Self-pay | Admitting: Psychiatry

## 2020-05-20 DIAGNOSIS — F4323 Adjustment disorder with mixed anxiety and depressed mood: Secondary | ICD-10-CM

## 2020-05-20 NOTE — Progress Notes (Signed)
[facility] Counselor/Therapist Progress Note  Patient ID: Tyler Mcconnell, MRN: [id number],    Date: 05/20/2020  Time Spent: 45 minutes   Treatment Type: Individual Therapy  Reported Symptoms: anxiety, irritability  Mental Status Exam:  Appearance:   Casual and Well Groomed     Behavior:  Appropriate  Motor:  Normal  Speech/Language:   Clear and Coherent  Affect:  Appropriate  Mood:  anxious and irritable  Thought process:  normal  Thought content:    WNL  Sensory/Perceptual disturbances:    WNL  Orientation:  oriented to person, place, time/date and situation  Attention:  Good  Concentration:  Good  Memory:  WNL  Fund of knowledge:   Good  Insight:    Good  Judgment:   Good  Impulse Control:  Good   Risk Assessment: Danger to Self:  No Self-injurious Behavior: No Danger to Others: No Duty to Warn:no Physical Aggression / Violence:No  Access to Firearms a concern: No  Gang Involvement:No   Subjective: The client states that he has been more consistently taking the l-theanine and 5 HTP.  He has noticed that he has had much less outbursts.  "It seems like I have more control but I am not sure if it is a placebo effect."  I encouraged the client to continue with that regimen. Client states that he is been working on recognizing the father and husband that he wants to be.  Today he wanted to discuss two decisions that he has been trying to make.  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  It is a huge commitment requiring a total of around 15 hours a month.  The second one is discussions with his company on whether or not to take on new responsibilities. Today we focused on the board position.  I used eye-movement with the client as he asked himself the question, "should I do this?"  He feels some anxiety in his stomach as well as excitement.  His subjective units of distress is a 3.  As the client processed  he stated he felt a sense of calm and peace about it.  He states he and his wife have talked about if this is a place that he wants to invest his time, treasure and talent?   As we continued processing he asked the question is this work God has called me to invest?  We discussed if this would take away time from his children?  He states actually he would be spending more time at the school so he would be around his children more.  As he continued to process the word "open" came up for him.  So far his wife has not felt a firm yes or no as she has prayed.  Tonight they will meet and make the final decision.  I gave the client a decision making form to fill out to allow him to rate the advantages and disadvantages of taking the board position or not taking the board position.  He will complete that tonight.  His subjective units of distress at the end of the session was 0.  Interventions: Motivational Interviewing, Solution-Oriented/Positive Psychology, Psycho-education/Bibliotherapy, Eye Movement Desensitization and Reprocessing (EMDR) and Insight-Oriented  Diagnosis:   ICD-10-CM   1. Adjustment disorder with mixed anxiety and depressed mood  F43.23     Plan: Mood independent behavior, continue with supplements, complete decision making  form, mindful prayer, positive self talk, self-care, exercise.  Alanah Sakuma, [facility]

## 2020-05-21 NOTE — Progress Notes (Signed)
Cardiology Office Note   Date:  05/24/2020   ID:  Tyler Mcconnell, DOB 06-Dec-1975, MRN [id number]  PCP:  Crist Infante, MD  Cardiologist:   Jemarion Roycroft Martinique, MD   Chief Complaint  Patient presents with  . Cardiomyopathy      History of Present Illness: Tyler Mcconnell is a 45 y.o. male who is seen today for evaluation of family history of hypertrophic cardiomyopathy. His brother and niece and nephew have been diagnosed with HCM and have genetic variant MYBPC3 p.Arg495Gln; [place]; 9191. His father and paternal grandmother had an abnormal Ecg and some cardiac irregularity but have not been formally tested for HCM.  The patient did undergo recent genetic testing at [facility] and also has the gene. No history of any cardiac complaints. Nuclear stress test in 2008 was normal. Echo done in November 2019 showed no structural findings for HCM.   He also has HLD. Coronary calcium score was 4.4 placing him at 79th percentile. He was started on statin therapy.   Denies any chest pain, dyspnea, dizziness, palpitations. He is active and runs and works out regularly 3-4 times a week. He has 3 children ages 22, 35, and 51. He does have hypercholesterolemia.     Past Medical History:  Diagnosis Date  . Abnormal EKG   . Anxiety   . Duodenitis   . GERD (gastroesophageal reflux disease)   . H. pylori infection   . H/O chest pain   . Heart murmur   . Hemorrhoids   . Hiatal hernia   . Hyperhidrosis   . Hyperlipidemia   . Panic attack   . Shingles     Past Surgical History:  Procedure Laterality Date  . MOLE REMOVAL    . TONSILLECTOMY    . VASECTOMY       Current Outpatient Medications  Medication Sig Dispense Refill  . famotidine (PEPCID) 10 MG tablet Take 10 mg by mouth as needed for heartburn or indigestion.    Marland Kitchen MINOXIDIL, TOPICAL, (ROGAINE EXTRA STRENGTH) 5 % SOLN as directed.    . rosuvastatin (CRESTOR) 20 MG tablet TAKE 1 TABLET BY MOUTH EVERY DAY 180 tablet 1   Current  Facility-Administered Medications  Medication Dose Route Frequency Provider Last Rate Last Admin  . 0.9 %  sodium chloride infusion  500 mL Intravenous Continuous Pyrtle, Lajuan Lines, MD        Allergies:   Codeine, Penicillins, and Pregabalin    Social History:  The patient  reports that he has never smoked. He has never used smokeless tobacco. He reports current alcohol use. He reports that he does not use drugs.   Family History:  The patient's family history includes Colon polyps in his sister; Heart disease in an other family member; Hyperlipidemia in his father; Irregular heart beat in his father and paternal grandmother; Prostate cancer in his paternal grandfather; Prostate cancer (age of onset: 35) in his father; Thyroid nodules (age of onset: 23) in his mother.    ROS:  Please see the history of present illness.   Otherwise, review of systems are positive for none.   All other systems are reviewed and negative.    PHYSICAL EXAM: VS:  BP 124/80   Pulse 67   Ht 5\' 10"  (1.778 m)   Wt 179 lb (81.2 kg)   SpO2 96%   BMI 25.68 kg/m  , BMI Body mass index is 25.68 kg/m. GEN: Well nourished, thin WM in no acute distress  HEENT: normal  Neck: no JVD, carotid bruits, or masses Cardiac: RRR; no murmurs, rubs, or gallops,no edema  Respiratory:  clear to auscultation bilaterally, normal work of breathing GI: soft, nontender, nondistended, + BS MS: no deformity or atrophy  Skin: warm and dry, no rash Neuro:  Strength and sensation are intact Psych: euthymic mood, full affect   EKG:  EKG is ordered today. The ekg ordered today demonstrates NSR rate 67. There are narrow Q waves in the inferior and lateral leads.  I have personally reviewed and interpreted this study.    Recent Labs: No results found for requested labs within last 8760 hours.    Lipid Panel    Component Value Date/Time   CHOL 137 07/11/2018 1009   TRIG 92 07/11/2018 1009   HDL 46 07/11/2018 1009   CHOLHDL 3.0  07/11/2018 1009   LDLCALC 73 07/11/2018 1009      Wt Readings from Last 3 Encounters:  05/24/20 179 lb (81.2 kg)  03/11/18 181 lb 9.6 oz (82.4 kg)  10/17/16 175 lb (79.4 kg)      Other studies Reviewed: Additional studies/ records that were reviewed today include: Labs dated 09/18/17: CBC, CMET, TSH normal.  Dated 01/22/18: cholesterol 246, triglycerides 114, HDL 50, LDL 173.  Dated 03/31/20: cholesterol 148, triglycerides 115, HDL 43, LDL 78. A1c 5.2%. CMET normal.   Echo 01/07/18: Study Conclusions  - Left ventricle: The cavity size was normal. Systolic function was   normal. The estimated ejection fraction was in the range of 55%   to 60%. Wall motion was normal; there were no regional wall   motion abnormalities. Left ventricular diastolic function   parameters were normal.  CLINICAL DATA:  Risk stratification  EXAM: Coronary Calcium Score  TECHNIQUE: The patient was scanned on a [employer] scanner. Axial non-contrast 3 mm slices were carried out through the heart. The data set was analyzed on a dedicated work station and scored using the [place].  FINDINGS: Non-cardiac: See separate report from [facility].  Ascending Aorta: Normal size, no calcifications.  Pericardium: Normal.  Coronary arteries: Normal origin.  IMPRESSION: Coronary calcium score of 4.4. This was 77 percentile for age and sex matched control.   Electronically Signed   By: Ena Dawley   On: 03/21/2018 16:29    ASSESSMENT AND PLAN:  1.  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  He has a brother and nephew with some morphologic changes and a niece who apparently has more severe changes and is s/p ICD implant. Likely inherited from his father.    2. Hypercholesterolemia. On Crestor. LDL 173>>78. Working on diet to try and get below 70.   3. Coronary calcification. Score  4.4 at 79th percentile. No symptoms.    Current medicines are reviewed at length with the patient today.  The patient does not have concerns regarding medicines.  The following changes have been made:  no change  Labs/ tests ordered today include:   No orders of the defined types were placed in this encounter.    Disposition:  Will follow up in 2 years and plan on repeating an Echo at that time.   Signed, Ellinor Test Martinique, MD  05/24/2020 4:21 PM    [facility] [address] [place], [place], [address] Phone [phone], Fax [phone]

## 2020-05-24 ENCOUNTER — Encounter: Payer: Self-pay | Admitting: Cardiology

## 2020-05-24 ENCOUNTER — Other Ambulatory Visit: Payer: Self-pay

## 2020-05-24 ENCOUNTER — Ambulatory Visit: Payer: 59 | Admitting: Cardiology

## 2020-05-24 VITALS — BP 124/80 | HR 67 | Ht 70.0 in | Wt 179.0 lb

## 2020-05-24 DIAGNOSIS — E7800 Pure hypercholesterolemia, unspecified: Secondary | ICD-10-CM

## 2020-05-24 DIAGNOSIS — I422 Other hypertrophic cardiomyopathy: Secondary | ICD-10-CM | POA: Diagnosis not present

## 2020-05-24 MED ORDER — ROSUVASTATIN CALCIUM 20 MG PO TABS: 20.0000 mg | ORAL_TABLET | Freq: Every day | ORAL | 3 refills | Status: DC

## 2020-06-03 ENCOUNTER — Encounter: Payer: Self-pay | Admitting: Psychiatry

## 2020-06-03 ENCOUNTER — Ambulatory Visit (INDEPENDENT_AMBULATORY_CARE_PROVIDER_SITE_OTHER): Payer: 59 | Admitting: Psychiatry

## 2020-06-03 ENCOUNTER — Other Ambulatory Visit: Payer: Self-pay

## 2020-06-03 DIAGNOSIS — F4323 Adjustment disorder with mixed anxiety and depressed mood: Secondary | ICD-10-CM | POA: Diagnosis not present

## 2020-06-03 NOTE — Progress Notes (Signed)
      [facility] Counselor/Therapist Progress Note  Patient ID: Tyler Mcconnell, MRN: [id number],    Date: 06/03/2020  Time Spent: 50 minutes   Treatment Type: Individual Therapy  Reported Symptoms: anxiety, irritability, stress  Mental Status Exam:  Appearance:   Well Groomed     Behavior:  Appropriate  Motor:  Normal  Speech/Language:   Clear and Coherent  Affect:  Appropriate  Mood:  anxious and irritable  Thought process:  normal  Thought content:    WNL  Sensory/Perceptual disturbances:    WNL  Orientation:  oriented to person, place, time/date and situation  Attention:  Good  Concentration:  Good  Memory:  WNL  Fund of knowledge:   Good  Insight:    Good  Judgment:   Good  Impulse Control:  Good   Risk Assessment: Danger to Self:  No Self-injurious Behavior: No Danger to Others: No Duty to Warn:no Physical Aggression / Violence:No  Access to Firearms a concern: No  Gang Involvement:No   Subjective: The client 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  He feels at peace about it and states the decision-making worksheet was very helpful.  He has not made a decision concerning new roles at his work.  That is to be continued.  The client notes that this morning he was getting his kids ready for school and fed so he could get here.  Usually he states his anxiety would be around a 10.  "I'd be grinding my teeth."  Today it was around 3.  He sees progress and improvement and has been using the l-theanine successfully.  "I have been such a slave to time".  The client is considering pulling away from his company to have a less hurried pace in life. Today we used eye-movement focusing on the client's issue with time and being hurried.  His negative cognition is, "I am wound up."  He feels it in his chest.  His subjective units of distress is a 5.  As the client processed I asked him how his hurriedness helps him?  He feels it can communicate to  others how capable he is.  He realizes he sacrifices relationships over performance.  He states that checking a box on the list gives him an endorphin rush.  As the client gain more insight into this he realized he needs to turn the hierarchy upside down.  "It needs to be relationships over the checklist."  We switched to the bilateral stimulation hand paddles.  The client visualized himself in the [place] and invited Jesus into the picture.  He was able to turn his performance orientation over to him.  He felt a release and peace.  His subjective units of distress was less than 1.  His positive cognition was, "I am his and I trust."  He realizes that he will have to choose because performance has been such a strong part of his life.  He feels he can do so.  Interventions: Assertiveness/Communication, Motivational Interviewing, Solution-Oriented/Positive Psychology, [employer] Group Desensitization and Reprocessing (EMDR) and Insight-Oriented  Diagnosis:   ICD-10-CM   1. Adjustment disorder with mixed anxiety and depressed mood  F43.23     Plan: Mood independent behavior, positive self talk, self-care, assertiveness, boundaries, radical acceptance, mindful prayer.  Tyler Mcconnell, [facility]

## 2020-06-07 ENCOUNTER — Ambulatory Visit: Payer: 59 | Admitting: Psychiatry

## 2020-06-21 ENCOUNTER — Encounter: Payer: Self-pay | Admitting: Psychiatry

## 2020-06-21 ENCOUNTER — Ambulatory Visit (INDEPENDENT_AMBULATORY_CARE_PROVIDER_SITE_OTHER): Payer: 59 | Admitting: Psychiatry

## 2020-06-21 ENCOUNTER — Other Ambulatory Visit: Payer: Self-pay

## 2020-06-21 DIAGNOSIS — F4323 Adjustment disorder with mixed anxiety and depressed mood: Secondary | ICD-10-CM

## 2020-06-21 NOTE — Progress Notes (Signed)
[facility] Counselor/Therapist Progress Note  Patient ID: Tyler Mcconnell, MRN: [id number],    Date: 06/21/2020  Time Spent: 50 minutes   Treatment Type: Individual Therapy  Reported Symptoms: sadness, anxiety, stress  Mental Status Exam:  Appearance:   Casual and Well Groomed     Behavior:  Appropriate  Motor:  Normal  Speech/Language:   Clear and Coherent  Affect:  Appropriate  Mood:  anxious and sad  Thought process:  normal  Thought content:    WNL  Sensory/Perceptual disturbances:    WNL  Orientation:  oriented to person, place, time/date and situation  Attention:  Good  Concentration:  Good  Memory:  WNL  Fund of knowledge:   Good  Insight:    Good  Judgment:   Good  Impulse Control:  Good   Risk Assessment: Danger to Self:  No Self-injurious Behavior: No Danger to Others: No Duty to Warn:no Physical Aggression / Violence:No  Access to Firearms a concern: No  Gang Involvement:No   Subjective: The client states he just returned from [place] on vacation.  "I have still been wondering if I should move on from my current job?"  He states he has been discussing this with his wife.  There is a deployment of a fund that will be happening at work over the next 3-1/2 years.  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  The client agreed that it would not be good for the company.  He and his wife decided that he would stay for the 3-1/2 years and then leave.  "It felt liberating."  He states he is not planning anything afterwards but will be open to what provision God will make for him. The client states that while in [place] his kids noticed that he was most stressed when he was around his dad.  His dad is easily irritated and cannot handle the clients kids if they get rambunctious or loud.  Today we used eye-movement focusing on this.  His negative cognition is, "I am stressed."  He feels stress in his hands and  head.  His subjective units of distress is a 4.  The client had the question, "why does my dad do that?"  The client states his children do not want to spend time with him when that happens.  The client states he feels too responsible.  We discussed that he is not responsible for his dad's emotional life.  The client admits that his dad can be very controlling.  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  When the client states that he bucks his dad's agenda, his dad looks at him in disbelief.  His dad's work has been such an Chiropractor not only for the client but his dad is well.  The client is sad that his dad is stuck where he is.  His dad is losing his legacy which is what he has wanted his whole life.  The client sees that leaving the company allows him to individuate more to separate and create his own calling.  The client's positive cognition at the end of the session was, "I can let go and give it to God."  His subjective units of distress was a 0. I also discussed with the client that I would be retiring April 29.  The client has agreed to follow-up with Jacqualine Mau, [facility].  The client has  his contact information.  Interventions: Assertiveness/Communication, Motivational Interviewing, Solution-Oriented/Positive Psychology, [employer] Group Desensitization and Reprocessing (EMDR) and Insight-Oriented  Diagnosis:   ICD-10-CM   1. Adjustment disorder with mixed anxiety and depressed mood  F43.23     Plan: Mood independent behavior, positive self talk, self-care, assertiveness, boundaries, acceptance, follow-up with Jacqualine Mau, Radom Kinley Dozier, [facility]

## 2020-12-23 ENCOUNTER — Telehealth: Payer: Self-pay | Admitting: Podiatrist

## 2020-12-23 NOTE — Telephone Encounter (Signed)
Patient called the office requesting to see you between today or Tuesday 12/28/20 . I told him the earliest I would be able to get him in the office would be Tuesday 12/28/20 with Dr.Hyatt . He still wanted me to send you a message asking if you would see him.

## 2020-12-28 ENCOUNTER — Ambulatory Visit: Payer: 59 | Admitting: Podiatry

## 2020-12-28 ENCOUNTER — Other Ambulatory Visit: Payer: Self-pay

## 2020-12-28 ENCOUNTER — Ambulatory Visit (INDEPENDENT_AMBULATORY_CARE_PROVIDER_SITE_OTHER): Payer: 59

## 2020-12-28 ENCOUNTER — Encounter: Payer: Self-pay | Admitting: Podiatry

## 2020-12-28 DIAGNOSIS — M778 Other enthesopathies, not elsewhere classified: Secondary | ICD-10-CM

## 2020-12-28 DIAGNOSIS — J069 Acute upper respiratory infection, unspecified: Secondary | ICD-10-CM | POA: Insufficient documentation

## 2020-12-28 DIAGNOSIS — M109 Gout, unspecified: Secondary | ICD-10-CM | POA: Diagnosis not present

## 2020-12-28 DIAGNOSIS — J45909 Unspecified asthma, uncomplicated: Secondary | ICD-10-CM | POA: Insufficient documentation

## 2020-12-28 MED ORDER — METHYLPREDNISOLONE 4 MG PO TBPK: ORAL_TABLET | ORAL | 0 refills | Status: DC

## 2020-12-28 MED ORDER — TRIAMCINOLONE ACETONIDE 40 MG/ML IJ SUSP
20.0000 mg | Freq: Once | INTRAMUSCULAR | Status: AC
  Administered 2020-12-28: 20 mg

## 2020-12-29 NOTE — Progress Notes (Signed)
Subjective:  Patient ID: Tyler Mcconnell, male    DOB: 22-Nov-1975,  MRN: [id number] HPI Chief Complaint  Patient presents with   Foot Pain    1st MPJ right - pain and stiffness x 2 weeks[place] indomethacin, better, but still swollen, red, and tender   New Patient (Initial Visit)    45 y.o. male presents with the above complaint.   ROS: Denies fever chills nausea vomiting muscle aches pains calf pain back pain chest pain shortness of breath.  Past Medical History:  Diagnosis Date   Abnormal EKG    Anxiety    Duodenitis    GERD (gastroesophageal reflux disease)    H. pylori infection    H/O chest pain    Heart murmur    Hemorrhoids    Hiatal hernia    Hyperhidrosis    Hyperlipidemia    Panic attack    Shingles    Past Surgical History:  Procedure Laterality Date   MOLE REMOVAL     TONSILLECTOMY     VASECTOMY      Current Outpatient Medications:    methylPREDNISolone (MEDROL DOSEPAK) 4 MG TBPK tablet, 6 day dose pack - take as directed, Disp: 21 tablet, Rfl: 0   famotidine (PEPCID) 10 MG tablet, Take 10 mg by mouth as needed for heartburn or indigestion., Disp: , Rfl:    fluorouracil (EFUDEX) 5 % cream, Apply topically., Disp: , Rfl:    MINOXIDIL, TOPICAL, (ROGAINE EXTRA STRENGTH) 5 % SOLN, as directed., Disp: , Rfl:    rosuvastatin (CRESTOR) 20 MG tablet, Take 1 tablet (20 mg total) by mouth daily., Disp: 90 tablet, Rfl: 3   traMADol (ULTRAM) 50 MG tablet, Take 50 mg by mouth every 8 (eight) hours as needed., Disp: , Rfl:   Current Facility-Administered Medications:    0.9 %  sodium chloride infusion, 500 mL, Intravenous, Continuous, Pyrtle, Lajuan Lines, MD  Allergies  Allergen Reactions   Codeine Other (See Comments)   Penicillins     REACTION: rash   Pregabalin Other (See Comments)   Review of Systems Objective:  There were no vitals filed for this visit.  General: Well developed, nourished, in no acute distress, alert and  oriented x3   Dermatological: Skin is warm, dry and supple bilateral. Nails x 10 are well maintained; remaining integument appears unremarkable at this time. There are no open sores, no preulcerative lesions, no rash or signs of infection present.  Vascular: Dorsalis Pedis artery and Posterior Tibial artery pedal pulses are 2/4 bilateral with immedate capillary fill time. Pedal hair growth present. No varicosities and no lower extremity edema present bilateral.   Neruologic: Grossly intact via light touch bilateral. Vibratory intact via tuning fork bilateral. Protective threshold with Semmes Wienstein monofilament intact to all pedal sites bilateral. Patellar and Achilles deep tendon reflexes 2+ bilateral. No Babinski or clonus noted bilateral.   Musculoskeletal: No gross boney pedal deformities bilateral. No pain, crepitus, or limitation noted with foot and ankle range of motion bilateral. Muscular strength 5/5 in all groups tested bilateral.  First metatarsophalangeal joint right foot does demonstrate some mild erythema and warmth.  There is no cellulitis drainage odor no open lesions or wounds.  Tenderness is noted on range of motion.  There is more redness right along the inferior aspect of the medial tubercle of the first metatarsal.    Gait: Unassisted, Nonantalgic.    Radiographs:  Radiographs taken today demonstrate an osseously mature individual  soft tissue swelling of the first metatarsophalangeal joint no erosions identified.  Mild elevatus with early dorsal spurring.  Assessment & Plan:   Assessment: Due to the lack of symptoms of sepsis or septic joint I feel that most likely this is gout or pseudogout.  Plan: Injected around the joint today 20 mg Kenalog 5 mg Marcaine point of maximal tenderness.  Darted him on a Medrol Dosepak.  Like to follow-up with him in 2 to 3 weeks to make sure he is doing well.  Should Mr. Wilden ever return to the office requesting to be seen for gout he  is to receive a prescription for an arthritic profile including uric acid sed rate ANA C-reactive protein and also a CBC.  This will need to be done immediately and we will follow-up with him that day or within the next day or so.  It does not matter which doctor he sees.     Kirkland Figg T. [place], [place]

## 2021-07-19 ENCOUNTER — Other Ambulatory Visit: Payer: Self-pay | Admitting: Cardiology

## 2021-08-23 ENCOUNTER — Encounter: Payer: Self-pay | Admitting: Internal Medicine

## 2021-09-21 ENCOUNTER — Ambulatory Visit (AMBULATORY_SURGERY_CENTER): Payer: 59 | Admitting: *Deleted

## 2021-09-21 VITALS — Ht 70.0 in | Wt 177.8 lb

## 2021-09-21 DIAGNOSIS — Z86010 Personal history of colonic polyps: Secondary | ICD-10-CM

## 2021-09-21 MED ORDER — NA SULFATE-K SULFATE-MG SULF 17.5-3.13-1.6 GM/177ML PO SOLN: 1.0000 | Freq: Once | ORAL | 0 refills | Status: AC

## 2021-09-21 NOTE — Progress Notes (Signed)
No egg or soy allergy known to patient  No issues known to pt with past sedation with any surgeries or procedures Patient denies ever being told they had issues or difficulty with intubation  No FH of Malignant Hyperthermia Pt is not on diet pills Pt is not on home 02  Pt is not on blood thinners  Pt denies issues with constipation  No A fib or A flutter Have any cardiac testing pending--NO Pt instructed to use Singlecare.com or GoodRx for a price reduction on prep   

## 2021-10-19 ENCOUNTER — Encounter: Payer: 59 | Admitting: Internal Medicine

## 2021-10-25 ENCOUNTER — Encounter: Payer: Self-pay | Admitting: Internal Medicine

## 2021-11-02 ENCOUNTER — Ambulatory Visit (AMBULATORY_SURGERY_CENTER): Payer: 59 | Admitting: Internal Medicine

## 2021-11-02 ENCOUNTER — Encounter: Payer: Self-pay | Admitting: Internal Medicine

## 2021-11-02 VITALS — BP 112/69 | HR 68 | Temp 98.9°F | Resp 23 | Ht 70.0 in | Wt 177.8 lb

## 2021-11-02 DIAGNOSIS — Z86010 Personal history of colonic polyps: Secondary | ICD-10-CM

## 2021-11-02 DIAGNOSIS — Z09 Encounter for follow-up examination after completed treatment for conditions other than malignant neoplasm: Secondary | ICD-10-CM

## 2021-11-02 MED ORDER — SODIUM CHLORIDE 0.9 % IV SOLN: 500.0000 mL | Freq: Once | INTRAVENOUS | Status: DC

## 2021-11-02 NOTE — Progress Notes (Signed)
GASTROENTEROLOGY PROCEDURE H&P NOTE   Primary Care Physician: Crist Infante, MD    Reason for Procedure:  History of sessile serrated polyp and history of multiple polyps in first-degree relative before age 44  Plan:    Colonoscopy  Patient is appropriate for endoscopic procedure(s) in the ambulatory ([place]) setting.  The nature of the procedure, as well as the risks, benefits, and alternatives were carefully and thoroughly reviewed with the patient. Ample time for discussion and questions allowed. The patient understood, was satisfied, and agreed to proceed.     HPI: Tyler Mcconnell is a 46 y.o. male who presents for surveillance colonoscopy.  Medical history as below.  Tolerated the prep.  No recent chest pain or shortness of breath.  No abdominal pain today.  Past Medical History:  Diagnosis Date   Abnormal EKG    Allergy    OCC.   Anxiety    DENIES UPDATED7/19/23   Duodenitis    GERD (gastroesophageal reflux disease)    H. pylori infection    H/O chest pain    Heart murmur    Hemorrhoids    Hiatal hernia    Hyperhidrosis    Hyperlipidemia    Panic attack    Shingles     Past Surgical History:  Procedure Laterality Date   COLONOSCOPY     MOLE REMOVAL     POLYPECTOMY     TONSILLECTOMY     VASECTOMY      Prior to Admission medications   Medication Sig Start Date End Date Taking? Authorizing Provider  MINOXIDIL, TOPICAL, (ROGAINE EXTRA STRENGTH) 5 % SOLN as directed. 09/26/17  Yes [provider]  OVER THE COUNTER MEDICATION daily. MEN'S ONE A DAY   Yes [provider]  rosuvastatin (CRESTOR) 20 MG tablet TAKE 1 TABLET BY MOUTH EVERY DAY 07/19/21  Yes Martinique, Peter M, MD  famotidine (PEPCID) 10 MG tablet Take 10 mg by mouth as needed for heartburn or indigestion.    [provider]    Current Outpatient Medications  Medication Sig Dispense Refill   MINOXIDIL, TOPICAL, (ROGAINE EXTRA STRENGTH) 5 % SOLN as directed.     OVER THE COUNTER  MEDICATION daily. MEN'S ONE A DAY     rosuvastatin (CRESTOR) 20 MG tablet TAKE 1 TABLET BY MOUTH EVERY DAY 90 tablet 2   famotidine (PEPCID) 10 MG tablet Take 10 mg by mouth as needed for heartburn or indigestion.     Current Facility-Administered Medications  Medication Dose Route Frequency Provider Last Rate Last Admin   0.9 %  sodium chloride infusion  500 mL Intravenous Continuous Lorenz Donley, Lajuan Lines, MD       0.9 %  sodium chloride infusion  500 mL Intravenous Once Damontre Millea, Lajuan Lines, MD        Allergies as of 11/02/2021 - Review Complete 11/02/2021  Allergen Reaction Noted   Codeine Other (See Comments) 04/07/2020   Penicillins     Pregabalin Other (See Comments) 04/07/2020    Family History  Problem Relation Age of Onset   Thyroid nodules Mother 40   Prostate cancer Father 56   Hyperlipidemia Father    Irregular heart beat Father    Colon polyps Sister    Irregular heart beat Paternal Grandmother    Prostate cancer Paternal Grandfather    Heart disease Other    Colon cancer Neg Hx    Crohn's disease Neg Hx    Esophageal cancer Neg Hx    Rectal cancer Neg Hx  Stomach cancer Neg Hx     Social History   Socioeconomic History   Marital status: Married    Spouse name: Development worker, international aid   Number of children: 3   Years of education: COLLEGE   Highest education level: Not on file  Occupational History   Not on file  Tobacco Use   Smoking status: Never    Passive exposure: Never   Smokeless tobacco: Never  Vaping Use   Vaping Use: Never used  Substance and Sexual Activity   Alcohol use: Yes    Comment: 3 DAYS A WEEK   Drug use: No   Sexual activity: Not on file  Other Topics Concern   Not on file  Social History Narrative   Not on file   Social Determinants of Health   Financial Resource Strain: Not on file  Food Insecurity: Not on file  Transportation Needs: Not on file  Physical Activity: Not on file  Stress: Not on file  Social Connections: Not on file   Intimate Partner Violence: Not on file    Physical Exam: Vital signs in last 24 hours: '@BP'$  128/67   Pulse 68   Temp 98.9 F (37.2 C) (Temporal)   Ht '5\' 10"'$  (1.778 m)   Wt 177 lb 12.8 oz (80.6 kg)   SpO2 99%   BMI 25.51 kg/m  GEN: NAD EYE: Sclerae anicteric ENT: MMM CV: Non-tachycardic Pulm: CTA b/l GI: Soft, NT/ND NEURO:  Alert & Oriented x 3   Zenovia Jarred, MD [facility]  11/02/2021 9:08 AM

## 2021-11-02 NOTE — Op Note (Signed)
[place] Patient Name: Tyler Mcconnell Procedure Date: 11/02/2021 9:05 AM MRN: [id number] Endoscopist: Jerene Bears , MD Age: 46 Referring MD:  Date of Birth: Aug 15, 1975 Gender: Male Account #: [id number] Procedure:                Colonoscopy Indications:              High risk colon cancer surveillance: Personal                            history of sessile serrated colon polyp (less than                            10 mm in size) with no dysplasia, Family history of                            multiple adenomatous polyps in 1st degree relative                            before age 76 (sister), Last colonoscopy: August                            2018 Medicines:                Monitored Anesthesia Care Procedure:                Pre-Anesthesia Assessment:                           - Prior to the procedure, a History and Physical                            was performed, and patient medications and                            allergies were reviewed. The patient's tolerance of                            previous anesthesia was also reviewed. The risks                            and benefits of the procedure and the sedation                            options and risks were discussed with the patient.                            All questions were answered, and informed consent                            was obtained. Prior Anticoagulants: The patient has                            taken no previous anticoagulant or antiplatelet  agents. ASA Grade Assessment: II - A patient with                            mild systemic disease. After reviewing the risks                            and benefits, the patient was deemed in                            satisfactory condition to undergo the procedure.                           After obtaining informed consent, the colonoscope                            was passed under direct vision. Throughout the                             procedure, the patient's blood pressure, pulse, and                            oxygen saturations were monitored continuously. The                            Olympus CF-HQ190L (Serial# [id number]) Colonoscope was                            introduced through the anus and advanced to the                            terminal ileum. The colonoscopy was performed                            without difficulty. The patient tolerated the                            procedure well. The quality of the bowel                            preparation was excellent. The terminal ileum,                            ileocecal valve, appendiceal orifice, and rectum                            were photographed. Scope In: 9:20:20 AM Scope Out: 9:35:50 AM Scope Withdrawal Time: 0 hours 11 minutes 29 seconds  Total Procedure Duration: 0 hours 15 minutes 30 seconds  Findings:                 The digital rectal exam was normal.                           The terminal ileum appeared normal.  The entire examined colon appeared normal on direct                            and retroflexion views. Complications:            No immediate complications. Estimated Blood Loss:     Estimated blood loss: none. Impression:               - The examined portion of the ileum was normal.                           - The entire examined colon is normal on direct and                            retroflexion views.                           - No specimens collected. Recommendation:           - Patient has a contact number available for                            emergencies. The signs and symptoms of potential                            delayed complications were discussed with the                            patient. Return to normal activities tomorrow.                            Written discharge instructions were provided to the                            patient.                           - Resume previous  diet.                           - Continue present medications.                           - Repeat colonoscopy in 5 years for surveillance. Jerene Bears, MD 11/02/2021 9:38:18 AM This report has been signed electronically.

## 2021-11-02 NOTE — Progress Notes (Signed)
VS completed by DT.  Pt's states no medical or surgical changes since previsit or office visit.  

## 2021-11-02 NOTE — Progress Notes (Signed)
To pacu, VSS. Report to [email or web address] 

## 2021-11-02 NOTE — Patient Instructions (Signed)
Resume previous diet and medications. Repeat Colonoscopy in 5 years for surveillance.  YOU HAD AN ENDOSCOPIC PROCEDURE TODAY AT THE [facility]:   Refer to the procedure report that was given to you for any specific questions about what was found during the examination.  If the procedure report does not answer your questions, please call your gastroenterologist to clarify.  If you requested that your care partner not be given the details of your procedure findings, then the procedure report has been included in a sealed envelope for you to review at your convenience later.  YOU SHOULD EXPECT: Some feelings of bloating in the abdomen. Passage of more gas than usual.  Walking can help get rid of the air that was put into your GI tract during the procedure and reduce the bloating. If you had a lower endoscopy (such as a colonoscopy or flexible sigmoidoscopy) you may notice spotting of blood in your stool or on the toilet paper. If you underwent a bowel prep for your procedure, you may not have a normal bowel movement for a few days.  Please Note:  You might notice some irritation and congestion in your nose or some drainage.  This is from the oxygen used during your procedure.  There is no need for concern and it should clear up in a day or so.  SYMPTOMS TO REPORT IMMEDIATELY:  Following lower endoscopy (colonoscopy or flexible sigmoidoscopy):  Excessive amounts of blood in the stool  Significant tenderness or worsening of abdominal pains  Swelling of the abdomen that is new, acute  Fever of 100F or higher  For urgent or emergent issues, a gastroenterologist can be reached at any hour by calling [phone]. Do not use MyChart messaging for urgent concerns.    DIET:  We do recommend a small meal at first, but then you may proceed to your regular diet.  Drink plenty of fluids but you should avoid alcoholic beverages for 24 hours.  ACTIVITY:  You should plan to take it easy for the  rest of today and you should NOT DRIVE or use heavy machinery until tomorrow (because of the sedation medicines used during the test).    FOLLOW UP: Our staff will call the number listed on your records the next business day following your procedure.  We will call around 7:15- 8:00 am to check on you and address any questions or concerns that you may have regarding the information given to you following your procedure. If we do not reach you, we will leave a message.  If you develop any symptoms (ie: fever, flu-like symptoms, shortness of breath, cough etc.) before then, please call [phone].  If you test positive for Covid 19 in the 2 weeks post procedure, please call and report this information to us.    If any biopsies were taken you will be contacted by phone or by letter within the next 1-3 weeks.  Please call us at [phone] if you have not heard about the biopsies in 3 weeks.    SIGNATURES/CONFIDENTIALITY: You and/or your care partner have signed paperwork which will be entered into your electronic medical record.  These signatures attest to the fact that that the information above on your After Visit Summary has been reviewed and is understood.  Full responsibility of the confidentiality of this discharge information lies with you and/or your care-partner.  

## 2021-11-03 ENCOUNTER — Telehealth: Payer: Self-pay

## 2021-11-03 NOTE — Telephone Encounter (Signed)
  Follow up Call-     11/02/2021    8:30 AM  Call back number  Post procedure Call Back phone  # [phone]  Permission to leave phone message Yes     Post op call attempted, no answer, left WM.

## 2022-03-21 ENCOUNTER — Other Ambulatory Visit (INDEPENDENT_AMBULATORY_CARE_PROVIDER_SITE_OTHER): Payer: 59

## 2022-03-21 ENCOUNTER — Telehealth: Payer: Self-pay | Admitting: Internal Medicine

## 2022-03-21 DIAGNOSIS — R109 Unspecified abdominal pain: Secondary | ICD-10-CM

## 2022-03-21 NOTE — Telephone Encounter (Signed)
Patient called with left side and back/flank pain Urine with foul smell No fever or hematuria  Unlikely GI related but urinalysis with culture recommended May have UTI  UA with culture ordered If positive will need antibiotic and likely PCP follow-up

## 2022-03-22 LAB — URINALYSIS, ROUTINE W REFLEX MICROSCOPIC
Bilirubin Urine: NEGATIVE
Hgb urine dipstick: NEGATIVE
Ketones, ur: NEGATIVE
Leukocytes,Ua: NEGATIVE
Nitrite: NEGATIVE
RBC / HPF: NONE SEEN (ref 0–?)
Specific Gravity, Urine: 1.02 (ref 1.000–1.030)
Total Protein, Urine: NEGATIVE
Urine Glucose: NEGATIVE
Urobilinogen, UA: 0.2 (ref 0.0–1.0)
WBC, UA: NONE SEEN (ref 0–?)
pH: 6.5 (ref 5.0–8.0)

## 2022-03-22 LAB — URINE CULTURE
MICRO NUMBER:: 14434926
Result:: NO GROWTH
SPECIMEN QUALITY:: ADEQUATE

## 2022-05-23 ENCOUNTER — Other Ambulatory Visit: Payer: Self-pay | Admitting: Cardiology

## 2022-05-24 ENCOUNTER — Other Ambulatory Visit: Payer: Self-pay | Admitting: Cardiology

## 2022-05-29 ENCOUNTER — Other Ambulatory Visit: Payer: Self-pay | Admitting: *Deleted

## 2022-05-29 MED ORDER — ROSUVASTATIN CALCIUM 20 MG PO TABS: 20.0000 mg | ORAL_TABLET | Freq: Every day | ORAL | 0 refills | Status: DC

## 2022-06-13 LAB — LAB REPORT - SCANNED: EGFR (Non-African Amer.): 91.7

## 2022-07-19 NOTE — Progress Notes (Unsigned)
Cardiology Office Note   Date:  07/25/2022   ID:  Tyler Mcconnell, DOB [birth year], MRN [id number]  PCP:  Rodrigo Ran, MD  Cardiologist:   Crystian Frith [place], MD   Chief Complaint  Patient presents with   Cardiomyopathy      History of Present Illness: Tyler Mcconnell is a 47 y.o. male who is seen today for evaluation of hypertrophic cardiomyopathy. Last seen in March 2022. His brother and niece and nephew have been diagnosed with HCM and have genetic variant MYBPC3 p.Arg495Gln; GeneDX; 4098. His father and paternal grandmother had an abnormal Ecg and some cardiac irregularity but have not been formally tested for HCM.  The patient did undergogenetic testing at [facility] and also has the gene. No history of any cardiac complaints. Nuclear stress test in 2008 was normal. Echo done in November 2019 showed no structural findings for HCM.   He also has HLD. Coronary calcium score in 2020 was 4.4 placing him at 79th percentile. He was started on statin therapy.   Denies any chest pain, dyspnea, dizziness, palpitations. He is active and runs and works out regularly 3-4 times a week. He has 3 children.  He does have hypercholesterolemia. When seen recently by Dr Waynard Edwards it was recommended he start on Zetia.    Past Medical History:  Diagnosis Date   Abnormal EKG    Allergy    OCC.   Anxiety    DENIES UPDATED7/19/23   Duodenitis    GERD (gastroesophageal reflux disease)    H. pylori infection    H/O chest pain    Heart murmur    Hemorrhoids    Hiatal hernia    Hyperhidrosis    Hyperlipidemia    Panic attack    Shingles     Past Surgical History:  Procedure Laterality Date   COLONOSCOPY     MOLE REMOVAL     POLYPECTOMY     TONSILLECTOMY     VASECTOMY       Current Outpatient Medications  Medication Sig Dispense Refill   ezetimibe (ZETIA) 10 MG tablet Take 1 tablet (10 mg total) by mouth daily. 90 tablet 3   MINOXIDIL, TOPICAL, (ROGAINE EXTRA STRENGTH) 5 % SOLN as directed.     OVER  THE COUNTER MEDICATION daily. MEN'S ONE A DAY     famotidine (PEPCID) 10 MG tablet Take 10 mg by mouth as needed for heartburn or indigestion. (Patient not taking: Reported on 07/25/2022)     rosuvastatin (CRESTOR) 20 MG tablet Take 1 tablet (20 mg total) by mouth daily. Pt. Will need to make an appointment in order to receive future refills. First Attempt. 90 tablet 3   No current facility-administered medications for this visit.    Allergies:   Codeine, Penicillins, and Pregabalin    Social History:  The patient  reports that he has never smoked. He has never been exposed to tobacco smoke. He has never used smokeless tobacco. He reports current alcohol use. He reports that he does not use drugs.   Family History:  The patient's family history includes Colon polyps in his sister; Heart disease in an other family member; Hyperlipidemia in his father; Irregular heart beat in his father and paternal grandmother; Prostate cancer in his paternal grandfather; Prostate cancer (age of onset: 74) in his father; Thyroid nodules (age of onset: 41) in his mother.    ROS:  Please see the history of present illness.   Otherwise, review of systems are positive for  none.   All other systems are reviewed and negative.    PHYSICAL EXAM: VS:  BP 126/74   Pulse 62   Ht 5\' 10"  (1.778 m)   Wt 178 lb 6.4 oz (80.9 kg)   SpO2 98%   BMI 25.60 kg/m  , BMI Body mass index is 25.6 kg/m. GEN: Well nourished, thin WM in no acute distress  HEENT: normal  Neck: no JVD, carotid bruits, or masses Cardiac: RRR; no murmurs, rubs, or gallops,no edema  Respiratory:  clear to auscultation bilaterally, normal work of breathing GI: soft, nontender, nondistended, + BS MS: no deformity or atrophy  Skin: warm and dry, no rash Neuro:  Strength and sensation are intact Psych: euthymic mood, full affect   EKG:  EKG is ordered today. The ekg ordered today demonstrates NSR rate 62. There are narrow Q waves in the inferior and  lateral leads. Unchanged from prior.  I have personally reviewed and interpreted this study.    Recent Labs: No results found for requested labs within last 365 days.    Lipid Panel    Component Value Date/Time   CHOL 137 07/11/2018 1009   TRIG 92 07/11/2018 1009   HDL 46 07/11/2018 1009   CHOLHDL 3.0 07/11/2018 1009   LDLCALC 73 07/11/2018 1009      Wt Readings from Last 3 Encounters:  07/25/22 178 lb 6.4 oz (80.9 kg)  11/02/21 177 lb 12.8 oz (80.6 kg)  09/21/21 177 lb 12.8 oz (80.6 kg)      Other studies Reviewed: Additional studies/ records that were reviewed today include: Labs dated 09/18/17: CBC, CMET, TSH normal.  Dated 01/22/18: cholesterol 246, triglycerides 114, HDL 50, LDL 173.  Dated 03/31/20: cholesterol 148, triglycerides 115, HDL 43, LDL 78. A1c 5.2%. CMET normal. Dated 06/13/22: cholesterol 166, triglycerides 154, HDL 45, LDL 90. A1c 5.1%. Normal CMET and TSH  Echo 01/07/18: Study Conclusions   - Left ventricle: The cavity size was normal. Systolic function was   normal. The estimated ejection fraction was in the range of 55%   to 60%. Wall motion was normal; there were no regional wall   motion abnormalities. Left ventricular diastolic function   parameters were normal.  CLINICAL DATA:  Risk stratification   EXAM: Coronary Calcium Score   TECHNIQUE: The patient was scanned on a [employer] scanner. Axial non-contrast 3 mm slices were carried out through the heart. The data set was analyzed on a dedicated work station and scored using the Agatson method.   FINDINGS: Non-cardiac: See separate report from [facility].   Ascending Aorta: Normal size, no calcifications.   Pericardium: Normal.   Coronary arteries: Normal origin.   IMPRESSION: Coronary calcium score of 4.4. This was 4 percentile for age and sex matched control.     Electronically Signed   By: Tobias Alexander   On: 03/21/2018 16:29     ASSESSMENT AND PLAN:  1.   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  He has a brother and nephew with some morphologic changes and a niece who apparently has more severe changes and is s/p ICD implant. Likely inherited from his father.  Recommend repeat Echo. Ecg is unchanged and he is asymptomatic. Will also look into getting his children tested for the gene.  2. Hypercholesterolemia. On Crestor. LDL 173>>78>> 90. On Crestor 20 mg daily. Agree with adding Zetia 10 mg daily. Follow up labs with Dr Waynard Edwards. If no  response to Zetia would consider PCSK 9 inhibitor.   3. Coronary calcification. Score 4.4 at 79th percentile. No symptoms. Continue risk factor modification.   Current medicines are reviewed at length with the patient today.  The patient does not have concerns regarding medicines.  The following changes have been made:  no change  Labs/ tests ordered today include:   Orders Placed This Encounter  Procedures   EKG 12-Lead     Disposition:  Will follow up in 2 years providing Echo is OK  Signed, Shmuel Girgis [place], MD  07/25/2022 9:27 AM    [facility] [address], [place], [place], [address] Phone [phone], Fax [phone]

## 2022-07-25 ENCOUNTER — Encounter: Payer: Self-pay | Admitting: Cardiology

## 2022-07-25 ENCOUNTER — Ambulatory Visit: Payer: 59 | Attending: Cardiology | Admitting: Cardiology

## 2022-07-25 VITALS — BP 126/74 | HR 62 | Ht 70.0 in | Wt 178.4 lb

## 2022-07-25 DIAGNOSIS — E7800 Pure hypercholesterolemia, unspecified: Secondary | ICD-10-CM | POA: Diagnosis not present

## 2022-07-25 DIAGNOSIS — Z8679 Personal history of other diseases of the circulatory system: Secondary | ICD-10-CM

## 2022-07-25 MED ORDER — EZETIMIBE 10 MG PO TABS: 10.0000 mg | ORAL_TABLET | Freq: Every day | ORAL | 3 refills | Status: AC

## 2022-07-25 MED ORDER — ROSUVASTATIN CALCIUM 20 MG PO TABS: 20.0000 mg | ORAL_TABLET | Freq: Every day | ORAL | 3 refills | Status: DC

## 2022-07-25 NOTE — Patient Instructions (Signed)
Medication Instructions:  Continue same medications *If you need a refill on your cardiac medications before your next appointment, please call your pharmacy*   Lab Work: None ordered   Testing/Procedures: Echo   Follow-Up: At [facility], you and your health needs are our priority.  As part of our continuing mission to provide you with exceptional heart care, we have created designated Provider Care Teams.  These Care Teams include your primary Cardiologist (physician) and Advanced Practice Providers (APPs -  Physician Assistants and Nurse Practitioners) who all work together to provide you with the care you need, when you need it.  We recommend signing up for the patient portal called "MyChart".  Sign up information is provided on this After Visit Summary.  MyChart is used to connect with patients for Virtual Visits (Telemedicine).  Patients are able to view lab/test results, encounter notes, upcoming appointments, etc.  Non-urgent messages can be sent to your provider as well.   To learn more about what you can do with MyChart, go to [email or web address].    Your next appointment:  2 years 07/2024        Provider:  Dr.Jordan

## 2022-08-02 ENCOUNTER — Other Ambulatory Visit: Payer: Self-pay

## 2022-08-02 DIAGNOSIS — Z8679 Personal history of other diseases of the circulatory system: Secondary | ICD-10-CM

## 2022-08-24 ENCOUNTER — Ambulatory Visit (HOSPITAL_COMMUNITY): Payer: 59 | Attending: Cardiology

## 2022-08-24 DIAGNOSIS — I422 Other hypertrophic cardiomyopathy: Secondary | ICD-10-CM

## 2022-08-24 DIAGNOSIS — E7800 Pure hypercholesterolemia, unspecified: Secondary | ICD-10-CM | POA: Insufficient documentation

## 2022-08-24 DIAGNOSIS — Z8679 Personal history of other diseases of the circulatory system: Secondary | ICD-10-CM | POA: Insufficient documentation

## 2022-08-24 LAB — ECHOCARDIOGRAM COMPLETE
Area-P 1/2: 3.37 cm2
S' Lateral: 2.7 cm

## 2022-08-29 ENCOUNTER — Telehealth: Payer: Self-pay | Admitting: Cardiology

## 2022-08-29 NOTE — Telephone Encounter (Signed)
Tyler M [place], MD 08/26/2022  2:38 PM EDT     This study demonstrates:  echo shows there is concentric LVH that is mild. No outflow gradient. Normal EF. Mitral valve function is normal Medication changes / Follow up studies / Other recommendations:   Compared to 2019 there is some hypertrophy of the myocardium but otherwise no typical findings of hypertrophic cardiomyopathy. Would recommend repeat Echo in 2 years unless new symptoms arise   Please send results to the PCP:  Tyler Ran, MD Tyler [place], MD 08/26/2022 2:36 PM   Spoke with pt regarding echocardiogram results. Pt verbalizes understanding. Results forwarded to Dr. Waynard Mcconnell.

## 2022-08-29 NOTE — Telephone Encounter (Signed)
Patient was returning call, ask that if he dont answer  you can leave the results on his VM. Please advise

## 2022-11-14 ENCOUNTER — Ambulatory Visit: Payer: 59 | Attending: Genetic Counselor | Admitting: Genetic Counselor

## 2022-11-15 ENCOUNTER — Other Ambulatory Visit: Payer: Self-pay | Admitting: Cardiology

## 2022-11-15 ENCOUNTER — Other Ambulatory Visit: Payer: Self-pay

## 2022-11-15 DIAGNOSIS — Z8679 Personal history of other diseases of the circulatory system: Secondary | ICD-10-CM

## 2022-11-21 NOTE — Progress Notes (Signed)
Referring Provider: Peter [place], MD  Pre Test Genetic Consult  Referral Reason  Tyler Mcconnell is referred for genetic consult and testing for the familial MYBPC3 variant, c.1484G>A, p.Arg495Gln (I696E) that is causal to hypertrophic cardiomyopathy.  Personal Medical Information Tyler Mcconnell (III.3 on pedigree) is a 47 year old Caucasian gentleman who is here today with is wife, Tyler Mcconnell and two sons, Tyler Mcconnell and Tyler Mcconnell. He is an avid runner for the last 20 years. He runs 3 miles a day, thrice a week at the rate of 9 minutes/mile. He reports no functional limitations or symptoms of dyspnea, fatigue, chest discomfort, heart palpitations, dizziness or syncope.  He underwent routine echocardiograms beginning in 2019 because of the family history of HCM. He did not present with cardiac hypertrophy in 2019 but his latest echocardiogram in 2024 demonstrated mild concentric left ventricular hypertrophy with LV PW of 1.4 cm and IVS of 1.4 cm without obstructive morphology and LVEF of 65%.  Traditional Risk Factors Tyler Mcconnell denies having HTN or aortic valve stenosis that can also lead to cardiac hypertrophy. He is a runner for the last 20 years.   Family history  The proband in his family is his niece (IV.2, indicated by arrow) who was diagnosed with HCM @ 35.  Relation to Patient Pedigree # Current age Heart condition/age of onset Notes  Sons, 2 IV.4, IV.6 13, 9 None IV.4- Normal Echo/EKG 2 11 IV.6-Echo/EKG not done  Daughter IV.5 11 None Echo/EKG not done  Brother III.2 52 None Genotype +ve Phenotype -ve  Maternal half-sister III.1 57 None Echo/EKG not done  Nephews-2 (sons of brother) IV.1, IV.3 23, 17 None IV.1- Does not have the familial variant IV.3- Genotype +ve Phenotype -ve, normal Echo/EKG @ 55  Niece (proband) IV.2 20 HCM @ 10 HCM detected upon undergoing cardiac screening subsequent to an abnormal EKG noted at age 105 when she went in for a second opinion for ADD and medication. ICD implanted  @17  with IVS of 3.0 cm at that time Currently followed by Dr. Rolly Mcconnell at [facility]. Symptomatic with dyspnea        Father II.4 79 None Echo/EKG nl @ 78  Paternal uncles, 3 II.1-II.3 77, 62, 56 None   Paternal grandfather I.1 Deceased None Died @ 43 -old age  Paternal grandmother I.2 Deceased Irregular heartbeat Died @ 94 -old age        Mother II.5 67 Afib @ 80>ablation Echo/EKG shows mild LVH (1.2cm)  Maternal aunts, 2 II.6-II.7 75, 66 None   Maternal grandfather I.3 Deceased None Died @ 17 -old age  Maternal grandmother I.4 Deceased None Died @ 59 -old age    Genetics Tyler Mcconnell was counseled on the genetics of hypertrophic cardiomyopathy (HCM). I explained to the patient that this is an autosomal dominant condition with incomplete penetrance i.e. not all individuals harboring the HCM mutation will present clinically with HCM, and age-related penetrance where clinical presentation of HCM increases with advanced age. Variability in clinical expression is also seen in families with HCM with affected family members presenting clinically at different ages and with symptoms ranging from mild to severe.  Since HCM is an autosomal dominant condition, first degree-relatives should seek regular surveillance for HCM.  The proband is his niece and her first-degree relatives, namely her father and siblings have undergone genetic testing and HCM screening.   Clinical screening of patient's first-degree relatives, if he tests positive for the familial variant involves echocardiogram and EKG at regular intervals, frequency is typically determined by age, with  those in their teens undergoing screening every year until the age of 61 and those over the age of 33 getting screened every 3-5 years. Patient verbalized understanding of this.  If this brother inherited the familial MYBPC3 pathogenic variant, the Tyler Mcconnell has a 50% chance of testing positive for the mutation.   If he has the familial pathogenic variant is  reported, it is likely he will develop HCM.  In light of variable expression and incomplete penetrance associated with HCM, it is not possible to predict when he will manifest clinically with HCM.   It is recommended that if he has the familial pathogenic variant then his first-degree relatives, namely his children undergo genetic testing as they are now at a 50% risk of inheriting this variant. It would be beneficial to have his parents also undergo genetic testing to determine if they inherited it from their mother or father so their siblings and children can undergo cascade screening and genetic testing for HCM.    Family members that test positive for the familial pathogenic variant should pursue clinical screening for HCM. Family members that test negative for the familial mutation need not pursue periodic screening for HCM, but seek care if symptoms develop.   If he does not have the mutation, it indicates that he did not inherit the mutant alleles and hence not at risk of presenting with HCM. He does not need further screening for HCM. Since he is negative, his children also do not need to undergo screening for HCM.  Impression  Tyler Mcconnell presents with mild concentric hypertrophy at age 39 in the in the presence of other loading conditions related to athletic activity. Additionally, he reports HCM due to a pathogenic MYBPC3 variant (L875I) in his niece that she inherited from her asymptomatic and phenotype negative father.    Genetic testing for the familial MYBPC3 pathogenic variant is recommended to determine if he has inherited this variant.  In addition, we discussed the protections afforded by the Genetic Information Non-Discrimination Act (GINA). I explained to the patient that GINA protects them from losing their employment or health insurance based on their genotype. However, these protections do not cover life insurance and disability. Patient verbalized understanding of this and states  that his children do not have life insurance coverage.  Please note that the patient has not been counseled in this visit on other personal, cultural, or ethical issues that they may face due to their heart condition.   Plan After a thorough discussion of the risk and benefits of genetic testing for HCM, Tyler Mcconnell states his intent to pursue genetic testing for familial MYBPC3 pathogenic variant and signed the informed consent form. Blood was drawn today for testing. His sons also had their blood drawn today to undergo testing if he is positive for the familial variant.     Sidney Ace, Ph.D, [facility] Clinical Molecular Geneticist

## 2023-01-16 ENCOUNTER — Ambulatory Visit: Payer: 59 | Attending: Genetic Counselor | Admitting: Genetic Counselor

## 2023-01-16 DIAGNOSIS — Z8679 Personal history of other diseases of the circulatory system: Secondary | ICD-10-CM

## 2023-01-19 NOTE — Progress Notes (Signed)
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 This is a telemedicine. Patient identity was confirmed with two unique identifiers.  Tyler Mcconnell reports no changes to his medical history or that of his children. 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  As we discussed previously, HCM is an autosomal dominant condition, his children are at a 50% risk of inheriting this mutation. He would like to wait until their life insurance is in place before pursuing genetic testing. He will inform us when he is ready for them to get tested. We also discussed having his maternal half-sister, age 69 undergo genetic testing for the familial variant to assess her status and follow-up for her and her four children if she tests positive for the mutation. He plans to discuss this with her.  Meanwhile, he has concerns about his cardiac dimension values and is interested having a cardiac MRI. I informed him of the HCM clinic at [facility] and the value in being followed by a cardiologist with expertise in HCM. He expressed interest in being evaluated at the HCM clinic by Dr. Izora Ribas and a referral for this has been placed.   Tyler Mcconnell, Ph.D, [facility] Clinical Molecular Geneticist

## 2023-01-22 ENCOUNTER — Telehealth: Payer: Self-pay | Admitting: Cardiology

## 2023-01-22 DIAGNOSIS — Z8679 Personal history of other diseases of the circulatory system: Secondary | ICD-10-CM

## 2023-01-22 NOTE — Telephone Encounter (Signed)
Patient calling in about schedule an cardiac mri but there is not order. Please advise

## 2023-02-14 NOTE — Telephone Encounter (Signed)
 Attempted to call patient, no answer left message requesting a call back.

## 2023-02-14 NOTE — Telephone Encounter (Signed)
Attempted to call patient, left detailed message informing patient Previous message sent to Dr. Jomarie Longs, if he has not received updated to contact office for additional assistance and request message sent to Dr. Jomarie Longs.

## 2023-02-14 NOTE — Telephone Encounter (Signed)
Patient following up, Dr. Jomarie Longs has recommended Cardiac MRI to be ordered by Dr. Swaziland. Please place order.

## 2023-02-14 NOTE — Telephone Encounter (Signed)
[place], Peter M, MD  Tobin Chad, RN; Meda Klinefelter D, LPN1 hour ago (2:47 PM)    I did review Dr Claudell Kyle note. Looks like he would like to see Dr Izora Ribas for evaluation of his HCM. I would go ahead and schedule this. Thanks  PJ   [place], Peter M, MD  Tobin Chad, RN; Meda Klinefelter D, LPN1 hour ago (2:45 PM)    I did see Dr Wynelle Link

## 2023-02-14 NOTE — Telephone Encounter (Signed)
Pt returning call to nurse

## 2023-02-14 NOTE — Telephone Encounter (Signed)
[place], Peter M, MD  You; Glennon Mac, PhD9 minutes ago (4:50 PM)    Looks like from Dr Claudell Kyle note he just needed to contact her to have his children tested  Peter Swaziland MD, [facility]

## 2023-02-14 NOTE — Telephone Encounter (Signed)
Left message  for patient -  I  Dr Swaziland is not in the office today - message has been sent for review.   Dr Swaziland will review Dr Sidney Ace note.  Once receive an order will follow through . Any question may call back

## 2023-02-14 NOTE — Telephone Encounter (Signed)
Patient identification verified by 2 forms. Tyler Rail, RN    Called and spoke to patient  Informed patient:   -Per Dr. Jomarie Longs he will need appointment with Dr. Izora Ribas, Dr. Swaziland in agreement   -referral placed for Dr. Izora Ribas  Patient states:   -Would like to know when his daughter Tyler Mcconnell 02/22/11) will complete genetic testing   -he was advised by Dr. Jomarie Longs and Dr. Swaziland to complete testing for his children   -he has completed testing for his other children but has not been able to for Vernona Rieger   -he has contacted office multiple times and has no been able to complete this for months   -needs assistance with this as soon as possible  Informed Patient message sent to Dr. Swaziland and Dr. Jomarie Longs for assistance  Patient verbalized understanding, no questions at this time

## 2023-02-19 ENCOUNTER — Telehealth: Payer: Self-pay | Admitting: Cardiology

## 2023-02-19 DIAGNOSIS — Z8679 Personal history of other diseases of the circulatory system: Secondary | ICD-10-CM

## 2023-02-19 NOTE — Telephone Encounter (Signed)
Patient will still need MRI prior to seeing Dr. Izora Ribas, can Dr. Swaziland place order for cardiac MRI? See 01/22/23 phone note

## 2023-02-20 NOTE — Telephone Encounter (Signed)
 Attempted to call patient, no answer left message requesting a call back.

## 2023-02-21 NOTE — Telephone Encounter (Signed)
Placed order for Cmri and CBC needed prior to Cmri.  Will send to primary RN to go over Cmri instructions and advise of need for CBC prior.

## 2023-02-21 NOTE — Telephone Encounter (Signed)
Called Mr. Burson to let him know that MRI has been ordered and once insurance authorizes he will get a call to schedule. Did advise him that a nurse will call to go over instructions and need for CBC prior.

## 2023-02-21 NOTE — Telephone Encounter (Signed)
   Patient identification verified by 2 forms. Marilynn Rail, RN    Called and spoke to patient  Informed patient:   -order for cardiac MRI placed   -will need to complete CBC prior to MRI   -he will be called to schedule MRI  RN reviewed instructions below  Patient verbalized understanding, no questions at this time   [facility] [address] [place], [place] [address] [phone] Please take advantage of the free valet parking available at the MAIN entrance (A entrance).  Proceed to the [facility] Radiology Department (First Floor) for check-in.    Magnetic resonance imaging (MRI) is a painless test that produces images of the inside of the body without using Xrays.  During an MRI, strong magnets and radio waves work together in a Data processing manager to form detailed images.   MRI images may provide more details about a medical condition than X-rays, CT scans, and ultrasounds can provide.  You may be given earphones to listen for instructions.  You may eat a light breakfast and take medications as ordered with the exception of HCTZ (fluid pill, other). Please avoid stimulants for 12 hr prior to test. (Ie. Caffeine, nicotine, chocolate, or antihistamine medications)  If a contrast material will be used, an IV will be inserted into one of your veins. Contrast material will be injected into your IV. It will leave your body through your urine within a day. You may be told to drink plenty of fluids to help flush the contrast material out of your system.  You will be asked to remove all metal, including: Watch, jewelry, and other metal objects including hearing aids, hair pieces and dentures. Also wearable glucose monitoring systems (ie. Freestyle Libre and Omnipods) (Braces and fillings normally are not a problem.)   TEST WILL TAKE APPROXIMATELY 1 HOUR  PLEASE NOTIFY SCHEDULING AT LEAST 24 HOURS IN ADVANCE IF YOU ARE UNABLE TO KEEP YOUR  APPOINTMENT. [phone]  Please call Rockwell Alexandria, cardiac imaging nurse navigator with any questions/concerns. Rockwell Alexandria RN Navigator Cardiac Imaging Larey Brick RN Navigator Cardiac Imaging [facility] Heart and Vascular Services [phone] Office

## 2023-02-21 NOTE — Telephone Encounter (Signed)
Hey Dr. Izora Ribas, can you order the cardiac MRI for this patient so that he can have this completed before he see's you on 04/12/23

## 2023-03-14 ENCOUNTER — Telehealth: Payer: Self-pay | Admitting: Cardiology

## 2023-03-14 ENCOUNTER — Other Ambulatory Visit: Payer: Self-pay

## 2023-03-14 DIAGNOSIS — Z8679 Personal history of other diseases of the circulatory system: Secondary | ICD-10-CM

## 2023-03-14 NOTE — Telephone Encounter (Signed)
 Patient called stating he now wants to move forward on getting genetic testing for his 3 children and wants a call back to discuss next steps.

## 2023-03-15 LAB — CBC
Hematocrit: 43.1 % (ref 37.5–51.0)
Hemoglobin: 13.9 g/dL (ref 13.0–17.7)
MCH: 31 pg (ref 26.6–33.0)
MCHC: 32.3 g/dL (ref 31.5–35.7)
MCV: 96 fL (ref 79–97)
Platelets: 256 10*3/uL (ref 150–450)
RBC: 4.49 x10E6/uL (ref 4.14–5.80)
RDW: 12.3 % (ref 11.6–15.4)
WBC: 5.5 10*3/uL (ref 3.4–10.8)

## 2023-03-16 ENCOUNTER — Encounter (HOSPITAL_COMMUNITY): Payer: Self-pay

## 2023-03-19 ENCOUNTER — Telehealth (HOSPITAL_COMMUNITY): Payer: Self-pay | Admitting: *Deleted

## 2023-03-19 NOTE — Telephone Encounter (Signed)
 Attempted to call patient regarding upcoming cardiac MRI appointment. Left message on voicemail with name and callback number  Larey Brick RN Navigator Cardiac Imaging [facility] Heart and Vascular Services [phone] Office [phone] Cell

## 2023-03-20 ENCOUNTER — Other Ambulatory Visit: Payer: Self-pay | Admitting: Internal Medicine

## 2023-03-20 ENCOUNTER — Ambulatory Visit (HOSPITAL_COMMUNITY)
Admission: RE | Admit: 2023-03-20 | Discharge: 2023-03-20 | Disposition: A | Discharge status: Home / Self Care | Payer: 59 | Source: Ambulatory Visit | Attending: Internal Medicine | Admitting: Internal Medicine

## 2023-03-20 DIAGNOSIS — Z8679 Personal history of other diseases of the circulatory system: Secondary | ICD-10-CM | POA: Diagnosis not present

## 2023-03-20 MED ORDER — GADOBUTROL 1 MMOL/ML IV SOLN
10.0000 mL | Freq: Once | INTRAVENOUS | Status: AC | PRN
  Administered 2023-03-20: 10 mL via INTRAVENOUS

## 2023-04-06 ENCOUNTER — Ambulatory Visit: Payer: 59 | Admitting: Cardiology

## 2023-04-12 ENCOUNTER — Encounter: Payer: Self-pay | Admitting: Internal Medicine

## 2023-04-12 ENCOUNTER — Ambulatory Visit: Payer: 59 | Attending: Internal Medicine | Admitting: Internal Medicine

## 2023-04-12 VITALS — BP 118/62 | HR 82 | Ht 70.0 in | Wt 178.0 lb

## 2023-04-12 DIAGNOSIS — I422 Other hypertrophic cardiomyopathy: Secondary | ICD-10-CM | POA: Insufficient documentation

## 2023-04-12 DIAGNOSIS — I2510 Atherosclerotic heart disease of native coronary artery without angina pectoris: Secondary | ICD-10-CM

## 2023-04-12 DIAGNOSIS — I340 Nonrheumatic mitral (valve) insufficiency: Secondary | ICD-10-CM

## 2023-04-12 NOTE — Progress Notes (Signed)
 Cardiology Office Note:  .    Date:  04/12/2023  ID:  [name], DOB June 01, 1975, MRN [id number] PCP: Shayne Anes, MD  [facility] Providers Cardiologist:  None     CC: Genetic Testing Consulted for the evaluation of HCM at the behest of Dr. Jordan   History of Present Illness: .    Tyler Mcconnell is a 48 y.o. male with genotype positive (MYBPC3) phenotype negative HCM.   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 He is currently asymptomatic with no chest pain, shortness of breath, fatigue, palpitations, or syncope. He maintains an active lifestyle, running three to four days a week and engaging in strength training exercises. His echocardiogram suggested moderate concentric hypertrophy, but the cardiac MRI did not confirm this, showing no significant hypertrophy or scarring.  He has a minimally elevated coronary artery calcium  score, which was considered high for his age when tested at 64. He has a history of hyperlipidemia and is on medication therapy to manage his cholesterol, which is under 55.  There is a significant family history of hypertrophic cardiomyopathy. His niece was diagnosed at a young age and has a pacemaker. His brother is gene positive but asymptomatic, and his brother's youngest child is also gene positive but asymptomatic. His daughter has been experiencing dizziness, which is a concern for him.  He has been working with a arts administrator, Dr. Virgel Pac, for genetic testing. He is eager to have his children tested due to his daughter's symptoms.  His three children have life insurance would he would like to get genetic testing done for him.  Relevant histories: .  Social- from [place], lived in [place] for a  bit, former [employer] ROS: As per HPI.   Studies Reviewed: .   Cardiac Studies & Procedures      ECHOCARDIOGRAM  ECHOCARDIOGRAM COMPLETE 08/24/2022  Narrative ECHOCARDIOGRAM REPORT    Patient Name:   Tyler Mcconnell   Date of Exam: 08/24/2022 Medical Rec #:  [id number]     Height:       70.0 in Accession #:    [id number]    Weight:       178.4 lb Date of Birth:  1975-11-03     BSA:          1.988 m Patient Age:    47 years      BP:           126/74 mmHg Patient Gender: M             HR:           60 bpm. Exam Location:  [address]  Procedure: 2D Echo, Cardiac Doppler, Color Doppler, 3D Echo and Strain Analysis  Indications:    Z86.79 Family history of hypertrophic cardiomyopathy  History:        Patient has prior history of Echocardiogram examinations, most recent 01/07/2018. Abnormal ECG, Signs/Symptoms:Chest Pain and Murmur; Risk Factors:Dyslipidemia. Asthma.  Sonographer:    Jon Hacker RCS Referring Phys: [id number] PETER M JORDAN  IMPRESSIONS   1. Though visually there does not appear to be significant LVOT obstruction, there is systolic anterior motion of the mitral subvalvular apparatus. Left ventricular ejection fraction, by estimation, is 60 to 65%. The left ventricle has normal  function. The left ventricle has no regional wall motion abnormalities. There is mild concentric left ventricular hypertrophy. Left ventricular diastolic parameters were normal. The average left ventricular global longitudinal strain is -19.8 %. The global longitudinal strain is normal. 2. Right ventricular systolic function is normal. The right ventricular size is normal. 3. The mitral valve is normal in structure. Trivial mitral valve regurgitation. No evidence of mitral stenosis. 4. The aortic valve is tricuspid. Aortic valve regurgitation is not visualized. No aortic stenosis is present. 5. The inferior vena cava is normal in size with greater than 50% respiratory variability, suggesting right  atrial pressure of 3 mmHg.  Comparison(s): No prior Echocardiogram.  Conclusion(s)/Recommendation(s): Otherwise normal echocardiogram, with minor abnormalities described in the report. Mild concentric LVH. Though no visual LVOT obstruction, there is SAM of the mitral subvalvular apparatus. Normal strain.  FINDINGS Left Ventricle: Though visually there does not appear to be significant LVOT obstruction, there is systolic anterior motion of the mitral subvalvular apparatus. Left ventricular ejection fraction, by estimation, is 60 to 65%. The left ventricle has normal function. The left ventricle has no regional wall motion abnormalities. The average left ventricular global longitudinal strain is -19.8 %. The global longitudinal strain is normal. The left ventricular internal cavity size was normal in size. There is mild concentric left ventricular hypertrophy. Left ventricular diastolic parameters were normal.  Right Ventricle: The right ventricular size is normal. No increase in right ventricular wall thickness. Right ventricular systolic function is normal.  Left Atrium: Left atrial size was normal in size.  Right Atrium: Right atrial size was normal in size.  Pericardium: There is no evidence of pericardial effusion.  Mitral Valve: The mitral valve is normal in structure. Trivial mitral valve regurgitation. No evidence of mitral valve stenosis.  Tricuspid Valve: The tricuspid valve is normal in structure. Tricuspid valve regurgitation is trivial. No evidence of tricuspid stenosis.  Aortic Valve: The aortic valve is tricuspid. Aortic valve regurgitation is not visualized. No aortic stenosis is present.  Pulmonic Valve: The pulmonic valve was grossly normal. Pulmonic valve regurgitation is trivial. No evidence of pulmonic stenosis.  Aorta: The aortic root, ascending aorta, aortic arch and descending aorta are all structurally normal, with no evidence of dilitation or obstruction.  Venous:  The inferior vena cava is normal in size with greater than 50% respiratory variability, suggesting right atrial pressure of 3 mmHg.  IAS/Shunts: The atrial septum is grossly normal.   LEFT VENTRICLE PLAX 2D LVIDd:         4.50 cm   Diastology LVIDs:         2.70 cm   LV e' medial:    10.40 cm/s LV PW:         1.40 cm   LV E/e' medial:  8.3 LV IVS:        1.40 cm   LV e' lateral:   14.30 cm/s LVOT diam:     2.20 cm   LV E/e' lateral: 6.0 LV SV:         83 LV SV Index:   42        2D Longitudinal Strain LVOT Area:     3.80 cm  2D Strain GLS (A2C):   -18.8 % 2D Strain GLS (A3C):   -21.4 % 2D Strain GLS (A4C):   -19.3 % 2D Strain GLS Avg:     -19.8 %  3D Volume EF: 3D EF:        62 % LV EDV:  144 ml LV ESV:       54 ml LV SV:        90 ml  RIGHT VENTRICLE RV Basal diam:  2.40 cm RV S prime:     16.10 cm/s TAPSE (M-mode): 2.6 cm  LEFT ATRIUM             Index        RIGHT ATRIUM           Index LA diam:        3.50 cm 1.76 cm/m   RA Area:     11.60 cm LA Vol (A2C):   36.6 ml 18.41 ml/m  RA Volume:   20.10 ml  10.11 ml/m LA Vol (A4C):   51.8 ml 26.05 ml/m LA Biplane Vol: 44.5 ml 22.38 ml/m AORTIC VALVE LVOT Vmax:   105.00 cm/s LVOT Vmean:  74.200 cm/s LVOT VTI:    0.219 m  AORTA Ao Root diam: 3.10 cm Ao Asc diam:  3.10 cm  MITRAL VALVE MV Area (PHT): 3.37 cm    SHUNTS MV Decel Time: 225 msec    Systemic VTI:  0.22 m MV E velocity: 86.50 cm/s  Systemic Diam: 2.20 cm MV A velocity: 54.00 cm/s MV E/A ratio:  1.60  Shelda Bruckner MD Electronically signed by Shelda Bruckner MD Signature Date/Time: 08/24/2022/3:07:43 PM    Final    CT SCANS  CT CARDIAC SCORING (SELF PAY ONLY) 03/21/2018  Addendum 03/21/2018  4:31 PM ADDENDUM REPORT: 03/21/2018 16:29  CLINICAL DATA:  Risk stratification  EXAM: Coronary Calcium  Score  TECHNIQUE: The patient was scanned on a [employer] scanner. Axial non-contrast 3 mm slices were carried out through  the heart. The data set was analyzed on a dedicated work station and scored using the Agatson method.  FINDINGS: Non-cardiac: See separate report from [facility].  Ascending Aorta: Normal size, no calcifications.  Pericardium: Normal.  Coronary arteries: Normal origin.  IMPRESSION: Coronary calcium  score of 4.4. This was 65 percentile for age and sex matched control.   Electronically Signed By: Leim Moose On: 03/21/2018 16:29  Narrative EXAM: OVER-READ INTERPRETATION  CT CHEST  The following report is an over-read performed by radiologist Dr. Franky Crease of [facility] on 03/21/2018. This over-read does not include interpretation of cardiac or coronary anatomy or pathology. The coronary calcium  score interpretation by the cardiologist is attached.  COMPARISON:  None.  FINDINGS: Vascular: Heart is normal size.  Visualized aorta is normal caliber.  Mediastinum/Nodes: No adenopathy in the lower mediastinum or hila.  Lungs/Pleura: Visualized lungs clear.  No effusions.  Upper Abdomen: Imaging into the upper abdomen shows no acute findings.  Musculoskeletal: Chest wall soft tissues are unremarkable. No acute bony abnormality.  IMPRESSION: No acute or significant extracardiac abnormality.  Electronically Signed: By: Franky Crease M.D. On: 03/21/2018 16:07  CARDIAC MRI  MR CARDIAC MORPHOLOGY W WO CONTRAST 03/20/2023  Narrative CLINICAL DATA:  Clinical question of hypertrophic cardiomyopathy Study assumes HCT of 43 and BSA of 2.00 m2.  EXAM: CARDIAC MRI  TECHNIQUE: The patient was scanned on a 1.5 Tesla GE magnet. A dedicated cardiac coil was used. Functional imaging was done using Fiesta sequences. 2,3, and 4 chamber views were done to assess for RWMA's. Modified Simpson's rule using a short axis stack was used to calculate an ejection fraction on a dedicated work [employer]. The patient received 10 cc of  Gadavist . After 10 minutes inversion recovery sequences were used to assess  for infiltration and scar tissue. Flow quantification was performed 2 times during this examination with flow quantification performed at the levels of the ascending aorta above the valve, pulmonary artery above the valve.  CONTRAST:  10 cc  of Gadavist   FINDINGS: 1. Mild dilation of left ventricular size, with LVEDD 55 mm, LVEDV 198 mL, and LVEDVi 99 mL/m2.  Normal left ventricular thickness, with intraventricular septal thickness of 9 mm, posterior wall thickness of 7 mm, apical thickness of 4 mm, and myocardial mass index of 61 g/m2.  Normal left ventricular systolic function (LVEF =63%). There are no regional wall motion abnormalities. Chordal systolic anterior motion of the anterior mitral valve leaflet without LVOT flow acceleration.  Left ventricular parametric mapping notable for normal T2 and ECV signal .  There is no late gadolinium enhancement in the left ventricular myocardium.  Normal rest perfusion.  2. Normal right ventricular size with RVEDVI 91 mL/m2.  Normal right ventricular thickness.  Normal right ventricular systolic function (RVEF =59%). There are no regional wall motion abnormalities or aneurysms.  3. Normal left and right atrial size. Prominent Chiari network (normal structure).  4. Normal size of the aortic root, ascending aorta and pulmonary artery.  5. Valve assessment:  Aortic Valve: Morphology not well visualized. No significant regurgitation.  Pulmonic Valve: No significant regurgitation.  Tricuspid Valve:No significant regurgitation.  Mitral Valve: No significant regurgitation.  6.  Normal pericardium.  No pericardial effusion.  7. Grossly, no extracardiac findings. Recommended dedicated study if concerned for non-cardiac pathology.  IMPRESSION: 1. Study does not meet phenotypic criteria for hypertrophic cardiomyopathy, even by family history  criteria.  2. Mild left ventricular dilation with normal biventricular function.  Stanly Leavens MD   Electronically Signed By: Stanly Leavens M.D. On: 03/20/2023 16:13           Physical Exam:    VS:  BP 118/62   Pulse 82   Ht 5' 10 (1.778 m)   Wt 178 lb (80.7 kg)   SpO2 96%   BMI 25.54 kg/m    Wt Readings from Last 3 Encounters:  04/12/23 178 lb (80.7 kg)  07/25/22 178 lb 6.4 oz (80.9 kg)  11/02/21 177 lb 12.8 oz (80.6 kg)    Gen: no distress   Neck: No JVD Cardiac: No Rubs or Gallops, systolic murmur only with standing and hand grip, RRR +2 radial pulses Respiratory: Clear to auscultation bilaterally, normal effort, normal  respiratory rate GI: Soft, nontender, non-distended  MS: No  edema;  moves all extremities Integument: Skin feels warm Neuro:  At time of evaluation, alert and oriented to person/place/time/situation  Psych: Normal affect, patient feels well   ASSESSMENT AND PLAN: .    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 by CMR-  With mild MR- echo in end of year - suspicion of Fabry's/Danon/Noonan's or other mimics of HCM: Non - Gene variant: MYBPC3  - NYHA I - Biomarkers: NA - pVO2: None- POET or CPET in 2026  Family history Reviewed, Discussed family screening  He has three children who have life insurance and would like their testing run; I have reached out to clinical genetics to facilitate this - Refer children to [facility] pediatric cardiology if genotype positive   - Arrange yearly echocardiograms for children if genotype positive    SCD  Assessment - SCD risk estimated to be < 1% at 5 years (AHA eval) - no ICD indication  No evidence of Atrial Fibrillation  Medication symptom plan: NA  No barriers to exercise due to lack of :  -a history of exercise-induced syncope or ventricular arrhythmias;  -medically refractory LVOT obstruction being evaluated for septal reduction therapy;  - history of  hypotensive response with exercise testing (>20 mm Hg decrease of systolic blood pressure from baseline blood pressure or an initial increase in systolic blood pressure followed by a decrease of systolic blood pressure >20 mm Hg) clinical decompensation in the previous 3 months, defined as [place]  Heart Association class IV congestive heart failure symptoms or [place] Cardiovascular Society class IV angina  - left ventricular ejection fraction less than 55% by echocardiography; life expectancy less than 12 month  Non HCM Care CAC & HLD: LDL < 55 at current regimen, statin mylagia at rosuvastatin  40 mg, discussed limited benefit of repeat CAC testing, offered HS-CRP and discussed data on colchicine  Genetic Therapy discussion:  - Discussed future monitoring, potential gene therapy, and measurement discrepancies between echocardiogram and MRI.   - Discussed MYPEAK1; he does not meet criteria for this trial  One year follow up with me or Dr. Jordan  Time Spent Directly with Patient:   I have spent a total of 41 minutes with the patient reviewing notes, imaging, EKGs, labs, CMR and examining the patient as well as establishing an assessment and plan that was discussed personally with the patient. Discussed disease state education, cardiac modeling discussing gene therapy and the use of colchicine. Reviewed care and plan in collaboration with clinical genetics (COHESION).   Stanly Leavens, MD FASE [facility] Cardiologist [facility]  [address] [place], [address] [place], [place] [address] [phone]  9:53 AM

## 2023-04-12 NOTE — Patient Instructions (Signed)
 Medication Instructions:  Your physician recommends that you continue on your current medications as directed. Please refer to the Current Medication list given to you today.  *If you need a refill on your cardiac medications before your next appointment, please call your pharmacy*   Lab Work: NONE If you have labs (blood work) drawn today and your tests are completely normal, you will receive your results only by: MyChart Message (if you have MyChart) OR A paper copy in the mail If you have any lab test that is abnormal or we need to change your treatment, we will call you to review the results.   Testing/Procedures: DEC 2025- - Your physician has requested that you have an echocardiogram. Echocardiography is a painless test that uses sound waves to create images of your heart. It provides your doctor with information about the size and shape of your heart and how well your heart's chambers and valves are working. This procedure takes approximately one hour. There are no restrictions for this procedure. Please do NOT wear cologne, perfume, aftershave, or lotions (deodorant is allowed). Please arrive 15 minutes prior to your appointment time.  Please note: We ask at that you not bring children with you during ultrasound (echo/ vascular) testing. Due to room size and safety concerns, children are not allowed in the ultrasound rooms during exams. Our front office staff cannot provide observation of children in our lobby area while testing is being conducted. An adult accompanying a patient to their appointment will only be allowed in the ultrasound room at the discretion of the ultrasound technician under special circumstances. We apologize for any inconvenience.    Follow-Up: At [facility], you and your health needs are our priority.  As part of our continuing mission to provide you with exceptional heart care, we have created designated Provider Care Teams.  These Care Teams  include your primary Cardiologist (physician) and Advanced Practice Providers (APPs -  Physician Assistants and Nurse Practitioners) who all work together to provide you with the care you need, when you need it.  We recommend signing up for the patient portal called "MyChart".  Sign up information is provided on this After Visit Summary.  MyChart is used to connect with patients for Virtual Visits (Telemedicine).  Patients are able to view lab/test results, encounter notes, upcoming appointments, etc.  Non-urgent messages can be sent to your provider as well.   To learn more about what you can do with MyChart, go to [email or web address].    Your next appointment:   1 year(s)  Provider:   Riley Lam, MD

## 2023-06-05 ENCOUNTER — Encounter: Payer: Self-pay | Admitting: Internal Medicine

## 2023-06-10 ENCOUNTER — Encounter: Payer: Self-pay | Admitting: Internal Medicine

## 2023-08-14 ENCOUNTER — Ambulatory Visit: Admitting: Genetic Counselor

## 2023-08-20 ENCOUNTER — Ambulatory Visit: Attending: Genetic Counselor | Admitting: Genetic Counselor

## 2023-08-20 DIAGNOSIS — Z8679 Personal history of other diseases of the circulatory system: Secondary | ICD-10-CM

## 2023-08-21 NOTE — Progress Notes (Signed)
 Post-test Genetic Consult  Durant's children had undergone genetic testing for the familial MYBPC3 pathogenic variant. He is here today for their post-test genetic consult. This is a telemedicine. Patient identity was confirmed with two unique identifiers.  Durant reports no changes in his medical history or that of his children. I informed him that his older two children did not inherit the familial MYBPC3 pathogenic variant. However, his youngest son harbors a copy (heterozygous) the familial pathogenic variant in MYBPC3 gene, namely [id number], p.[id number]. As his youngest son is genotype positive, it is important for him to undergo annual screening by echocardiogram and EKG until the age of 43. Zebedee Hibbs is aware of this and has had his son evaluated at [facility]. He reports that his son's cardiac screen was normal as was his own.    Verdis Glade, Ph.D, [facility] Clinical Molecular Geneticist

## 2023-09-05 ENCOUNTER — Other Ambulatory Visit: Payer: Self-pay | Admitting: Cardiology

## 2023-10-10 ENCOUNTER — Ambulatory Visit: Admitting: Family Medicine

## 2023-10-10 VITALS — BP 102/62 | Ht 70.0 in | Wt 177.0 lb

## 2023-10-10 DIAGNOSIS — M19072 Primary osteoarthritis, left ankle and foot: Secondary | ICD-10-CM | POA: Diagnosis not present

## 2023-10-11 ENCOUNTER — Encounter: Payer: Self-pay | Admitting: Family Medicine

## 2023-10-11 NOTE — Progress Notes (Signed)
 PCP: Shayne Anes, MD  Subjective:   HPI: Patient is a 48 y.o. male here for custom orthotics.  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 He runs about 3-4 days a week and feels this neuropathy. Would like to see if custom orthotics can help with some of his symptoms.  Past Medical History:  Diagnosis Date   Abnormal EKG    Allergy    OCC.   Anxiety    DENIES UPDATED7/19/23   Duodenitis    GERD (gastroesophageal reflux disease)    H. pylori infection    H/O chest pain    Heart murmur    Hemorrhoids    Hiatal hernia    Hyperhidrosis    Hyperlipidemia    Panic attack    Shingles     Current Outpatient Medications on File Prior to Visit  Medication Sig Dispense Refill   ezetimibe  (ZETIA ) 10 MG tablet Take 1 tablet (10 mg total) by mouth daily. 90 tablet 3   famotidine (PEPCID) 10 MG tablet Take 10 mg by mouth as needed for heartburn or indigestion.     MINOXIDIL, TOPICAL, (ROGAINE EXTRA STRENGTH) 5 % SOLN as directed.     OVER THE COUNTER MEDICATION daily. MEN'S ONE A DAY     rosuvastatin  (CRESTOR ) 20 MG tablet TAKE 1 TABLET BY MOUTH EVERY DAY NEED APPT FOR FUTURE REFILLS 90 tablet 1   No current facility-administered medications on file prior to visit.    Past Surgical History:  Procedure Laterality Date   COLONOSCOPY     MOLE REMOVAL     POLYPECTOMY     TONSILLECTOMY     VASECTOMY      Allergies  Allergen Reactions   Codeine Other (See Comments)   Penicillins     REACTION: rash   Pregabalin Other (See Comments)    BP 102/62   Ht 5' 10 (1.778 m)   Wt 177 lb (80.3 kg)   BMI 25.40 kg/m       No data to display              No data to display              Objective:  Physical Exam:  Gen: NAD, comfortable in exam room  Bilateral feet/ankles: Mod overpronation.  Lateral deviation of bilateral 2nd distal phalanges (he reports this  has been present from a very young age).  Small callus over 2nd MT head.  No hallux rigidus.  No swelling, other deformity. Full range of motion No tenderness to palpation  Negative ant drawer and negative talar tilt.   NV intact distally.   Assessment & Plan:  1. Left foot pain - 2/2 midfoot arthritis.  Wonder if his pronation is causing some irritation of cutaneous nerve in location of his midfoot arthritis.  Orthotics felt comfortable in the office.  Patient was fitted for a : standard, cushioned, semi-rigid orthotic. The orthotic was heated and afterward the patient stood on the orthotic blank positioned on the orthotic stand. The patient was positioned in subtalar neutral position and 10 degrees of ankle dorsiflexion in a weight bearing stance. After completion of molding, a stable base was applied to the orthotic blank. The blank was ground to a stable position for weight bearing. Size: 13 Fit & run Base: none Posting: none Additional orthotic padding: none  Total Visit time 25  minutes including documentation.

## 2023-10-30 ENCOUNTER — Other Ambulatory Visit: Payer: Self-pay | Admitting: Internal Medicine

## 2023-10-30 DIAGNOSIS — E041 Nontoxic single thyroid nodule: Secondary | ICD-10-CM

## 2023-11-12 ENCOUNTER — Ambulatory Visit
Admission: RE | Admit: 2023-11-12 | Discharge: 2023-11-12 | Disposition: A | Discharge status: Home / Self Care | Source: Ambulatory Visit | Attending: Internal Medicine | Admitting: Internal Medicine

## 2023-11-12 DIAGNOSIS — E041 Nontoxic single thyroid nodule: Secondary | ICD-10-CM

## 2023-11-23 ENCOUNTER — Other Ambulatory Visit: Payer: Self-pay | Admitting: Internal Medicine

## 2023-11-23 DIAGNOSIS — E041 Nontoxic single thyroid nodule: Secondary | ICD-10-CM

## 2023-12-13 ENCOUNTER — Other Ambulatory Visit (HOSPITAL_COMMUNITY)
Admission: RE | Admit: 2023-12-13 | Discharge: 2023-12-13 | Disposition: A | Discharge status: Home / Self Care | Source: Ambulatory Visit | Attending: Internal Medicine | Admitting: Internal Medicine

## 2023-12-13 ENCOUNTER — Inpatient Hospital Stay
Admission: RE | Admit: 2023-12-13 | Discharge: 2023-12-13 | Disposition: A | Source: Ambulatory Visit | Attending: Internal Medicine | Admitting: Internal Medicine

## 2023-12-13 DIAGNOSIS — E041 Nontoxic single thyroid nodule: Secondary | ICD-10-CM | POA: Insufficient documentation

## 2023-12-17 LAB — CYTOLOGY - NON PAP

## 2024-02-14 ENCOUNTER — Ambulatory Visit (HOSPITAL_COMMUNITY)
Admission: RE | Admit: 2024-02-14 | Discharge: 2024-02-14 | Disposition: A | Discharge status: Home / Self Care | Payer: 59 | Source: Ambulatory Visit | Attending: Internal Medicine | Admitting: Internal Medicine

## 2024-02-14 DIAGNOSIS — I340 Nonrheumatic mitral (valve) insufficiency: Secondary | ICD-10-CM | POA: Insufficient documentation

## 2024-02-14 LAB — ECHOCARDIOGRAM COMPLETE
Area-P 1/2: 3.16 cm2
S' Lateral: 3.3 cm

## 2024-02-15 ENCOUNTER — Ambulatory Visit: Payer: Self-pay | Admitting: Internal Medicine

## 2024-03-10 ENCOUNTER — Other Ambulatory Visit: Payer: Self-pay | Admitting: Cardiology
